# Patient Record
Sex: Female | Born: 1945 | Race: White | Hispanic: No | Marital: Married | Smoking: Former smoker
Health system: Southern US, Community
[De-identification: ages and names within clinical notes are randomized; demographics above are authoritative.]

## PROBLEM LIST (undated history)

## (undated) DIAGNOSIS — D494 Neoplasm of unspecified behavior of bladder: Secondary | ICD-10-CM

## (undated) DIAGNOSIS — I729 Aneurysm of unspecified site: Secondary | ICD-10-CM

## (undated) DIAGNOSIS — J449 Chronic obstructive pulmonary disease, unspecified: Secondary | ICD-10-CM

## (undated) DIAGNOSIS — R0602 Shortness of breath: Secondary | ICD-10-CM

## (undated) DIAGNOSIS — I10 Essential (primary) hypertension: Secondary | ICD-10-CM

## (undated) DIAGNOSIS — M1990 Unspecified osteoarthritis, unspecified site: Secondary | ICD-10-CM

## (undated) DIAGNOSIS — R011 Cardiac murmur, unspecified: Secondary | ICD-10-CM

## (undated) DIAGNOSIS — E039 Hypothyroidism, unspecified: Secondary | ICD-10-CM

## (undated) DIAGNOSIS — J45909 Unspecified asthma, uncomplicated: Secondary | ICD-10-CM

## (undated) DIAGNOSIS — E119 Type 2 diabetes mellitus without complications: Secondary | ICD-10-CM

## (undated) DIAGNOSIS — Z86718 Personal history of other venous thrombosis and embolism: Secondary | ICD-10-CM

---
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
## 1974-08-10 HISTORY — PX: TUBAL LIGATION: SHX77

## 2009-08-10 DIAGNOSIS — I729 Aneurysm of unspecified site: Secondary | ICD-10-CM

## 2009-08-10 HISTORY — DX: Aneurysm of unspecified site: I72.9

## 2009-12-08 DIAGNOSIS — Z86718 Personal history of other venous thrombosis and embolism: Secondary | ICD-10-CM

## 2009-12-08 HISTORY — DX: Personal history of other venous thrombosis and embolism: Z86.718

## 2012-07-06 ENCOUNTER — Other Ambulatory Visit: Payer: Self-pay | Admitting: Neurosurgery

## 2012-07-06 DIAGNOSIS — M48061 Spinal stenosis, lumbar region without neurogenic claudication: Secondary | ICD-10-CM

## 2012-07-20 ENCOUNTER — Ambulatory Visit
Admission: RE | Admit: 2012-07-20 | Discharge: 2012-07-20 | Disposition: A | Discharge status: Home / Self Care | Payer: Medicare Other | Source: Ambulatory Visit | Attending: Neurosurgery | Admitting: Neurosurgery

## 2012-07-20 ENCOUNTER — Other Ambulatory Visit: Payer: Self-pay | Admitting: Neurosurgery

## 2012-07-20 DIAGNOSIS — M48061 Spinal stenosis, lumbar region without neurogenic claudication: Secondary | ICD-10-CM

## 2012-08-09 ENCOUNTER — Encounter (HOSPITAL_COMMUNITY): Payer: Self-pay | Admitting: Pharmacy Technician

## 2012-08-17 ENCOUNTER — Encounter (HOSPITAL_COMMUNITY): Admission: RE | Admit: 2012-08-17 | Payer: Medicare Other | Source: Ambulatory Visit

## 2012-08-25 ENCOUNTER — Encounter (HOSPITAL_COMMUNITY): Payer: Self-pay

## 2012-08-25 ENCOUNTER — Encounter (HOSPITAL_COMMUNITY)
Admission: RE | Admit: 2012-08-25 | Discharge: 2012-08-25 | Disposition: A | Discharge status: Home / Self Care | Payer: Medicare Other | Source: Ambulatory Visit | Attending: Neurosurgery | Admitting: Neurosurgery

## 2012-08-25 ENCOUNTER — Encounter (HOSPITAL_COMMUNITY)
Admission: RE | Admit: 2012-08-25 | Discharge: 2012-08-25 | Disposition: A | Discharge status: Home / Self Care | Payer: Medicare Other | Source: Ambulatory Visit | Attending: Anesthesiology | Admitting: Anesthesiology

## 2012-08-25 ENCOUNTER — Encounter (HOSPITAL_COMMUNITY): Payer: Self-pay | Admitting: Pharmacy Technician

## 2012-08-25 HISTORY — DX: Personal history of other venous thrombosis and embolism: Z86.718

## 2012-08-25 HISTORY — DX: Unspecified osteoarthritis, unspecified site: M19.90

## 2012-08-25 HISTORY — DX: Neoplasm of unspecified behavior of bladder: D49.4

## 2012-08-25 HISTORY — DX: Shortness of breath: R06.02

## 2012-08-25 HISTORY — DX: Essential (primary) hypertension: I10

## 2012-08-25 HISTORY — DX: Aneurysm of unspecified site: I72.9

## 2012-08-25 HISTORY — DX: Chronic obstructive pulmonary disease, unspecified: J44.9

## 2012-08-25 HISTORY — DX: Cardiac murmur, unspecified: R01.1

## 2012-08-25 HISTORY — DX: Hypothyroidism, unspecified: E03.9

## 2012-08-25 LAB — SURGICAL PCR SCREEN: Staphylococcus aureus: NEGATIVE

## 2012-08-25 LAB — BASIC METABOLIC PANEL
CO2: 25 mEq/L (ref 19–32)
Calcium: 10.6 mg/dL — ABNORMAL HIGH (ref 8.4–10.5)
Chloride: 96 mEq/L (ref 96–112)
GFR calc Af Amer: >90 mL/min (ref >90)
Sodium: 136 mEq/L (ref 135–145)

## 2012-08-25 LAB — ABO/RH: ABO/RH(D): O NEG

## 2012-08-25 LAB — TYPE AND SCREEN

## 2012-08-25 LAB — CBC
HCT: 43.5 % (ref 36.0–46.0)
MCV: 95.6 fL (ref 78.0–100.0)
Platelets: 436 10*3/uL — ABNORMAL HIGH (ref 150–400)
RBC: 4.55 MIL/uL (ref 3.87–5.11)
WBC: 7.9 10*3/uL (ref 4.0–10.5)

## 2012-08-25 NOTE — Progress Notes (Signed)
Pt here for PAT.  Denies sleep apnea and/or sleep studies.  Reports had hx of blood clot in lung(2000's) that is resolved.  Sleep w/ O2@2L  at night because of COPD.  See Pulmonologist: Dr. 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 Angelina Ok, [facility], [place], [phone]).  Requested copies of ECHO, medical clearance,and OV note from Dr. Carney Harder. Pt seeing next week for routine f/u.   Requested copies of OV Note, medical clearance from Dr. Angelina Ok.

## 2012-08-25 NOTE — Pre-Procedure Instructions (Addendum)
Megan Fleming  08/25/2012   Your procedure is scheduled on: 09/02/12  Report to [facility] at 530 AM.  Call this number if you have problems the morning of surgery: [phone]   Remember:   Do not eat food or drink liquids after midnight.   Take these medicines the morning of surgery with A SIP OF WATER:inhalers, veramyst, levothyroxine, nebivolol (bystolic), effexor STOP  Krill oil, multivit   Do not wear jewelry, make-up or nail polish.  Do not wear lotions, powders, or perfumes. You may not wear deodorant.  Do not shave 48 hours prior to surgery. Men may shave face and neck.  Do not bring valuables to the hospital.  Contacts, dentures or bridgework may not be worn into surgery.  Leave suitcase in the car. After surgery it may be brought to your room.  For patients admitted to the hospital, checkout time is 11:00 AM the day of  discharge.   Patients discharged the day of surgery will not be allowed to drive  home.  Name and phone number of your driver: Megan Fleming, husban  Special Instructions: Shower using CHG 2 nights before surgery and the night before surgery.  If you shower the day of surgery use CHG.  Use special wash - you have one bottle of CHG for all showers.  You should use approximately 1/3 of the bottle for each shower.   Please read over the following fact sheets that you were given: Pain Booklet, Coughing and Deep Breathing, MRSA Information and Surgical Site Infection Prevention

## 2012-08-29 ENCOUNTER — Encounter (HOSPITAL_COMMUNITY): Payer: Self-pay | Admitting: Vascular Surgery

## 2012-08-29 NOTE — Consult Note (Signed)
Anesthesia chart review: Patient is a 67 year old female scheduled for L3-4, L4-5 PLIF by Dr. Gerlene Fee on 09/03/2011. History includes former smoker, obesity, hypertension, hypothyroidism, COPD with home O2 with sleep and exertion as needed, heart murmur (not specified) but with mild AR/TR by echo 11/2010, AAA (4.26 cm on 08/23/12 by ultrasound (Dr. Angelina Ok), arthritis, PE '11, bladder tumor ?'00 by Epic history.  She is followed at [facility], last visit on 03/02/12 with Lysle Pearl, PA-C.  I requested her last PFT studies as available.  EKG on 08/25/12 showed NSR.  Echo on 11/19/10 showed normal LV systolic function, LVEF 70%, normal diastolic filling pattern for age, mild aortic valve sclerosis with trace to mild AR, mild TR with normal pulmonary pressures.    CXR on 08/25/12 showed emphysematous and bronchitic changes consistent with COPD.  Preoperative labs noted.  I was not asked to evaluate patient during her PAT visit. RA O2 sat 97%.  I reviewed history with Anesthesiologist Dr. Krista Blue.  She will be evaluated by her assigned anesthesiologist on the day of surgery.  If not acute respiratory symptoms then would anticipate she could proceed as planned.  Shonna Chock, PA-C 08/29/12 1322

## 2012-09-01 MED ORDER — CEFAZOLIN SODIUM-DEXTROSE 2-3 GM-% IV SOLR
2.0000 g | INTRAVENOUS | Status: AC
  Administered 2012-09-02: 2 g via INTRAVENOUS
  Filled 2012-09-01: qty 50

## 2012-09-02 ENCOUNTER — Inpatient Hospital Stay (HOSPITAL_COMMUNITY): Payer: Medicare Other | Admitting: Vascular Surgery

## 2012-09-02 ENCOUNTER — Inpatient Hospital Stay (HOSPITAL_COMMUNITY): Payer: Medicare Other

## 2012-09-02 ENCOUNTER — Encounter (HOSPITAL_COMMUNITY)
Admission: RE | Disposition: A | Discharge status: Home / Self Care | Payer: Self-pay | Source: Ambulatory Visit | Attending: Neurosurgery

## 2012-09-02 ENCOUNTER — Encounter (HOSPITAL_COMMUNITY): Payer: Self-pay | Admitting: Vascular Surgery

## 2012-09-02 ENCOUNTER — Encounter (HOSPITAL_COMMUNITY): Payer: Self-pay | Admitting: *Deleted

## 2012-09-02 ENCOUNTER — Inpatient Hospital Stay (HOSPITAL_COMMUNITY)
Admission: RE | Admit: 2012-09-02 | Discharge: 2012-09-05 | DRG: 460 | Disposition: A | Discharge status: Home / Self Care | Payer: Medicare Other | Source: Ambulatory Visit | Attending: Neurosurgery | Admitting: Neurosurgery

## 2012-09-02 DIAGNOSIS — I10 Essential (primary) hypertension: Secondary | ICD-10-CM | POA: Diagnosis present

## 2012-09-02 DIAGNOSIS — E119 Type 2 diabetes mellitus without complications: Secondary | ICD-10-CM | POA: Diagnosis present

## 2012-09-02 DIAGNOSIS — Z7982 Long term (current) use of aspirin: Secondary | ICD-10-CM

## 2012-09-02 DIAGNOSIS — J449 Chronic obstructive pulmonary disease, unspecified: Secondary | ICD-10-CM | POA: Diagnosis present

## 2012-09-02 DIAGNOSIS — M48061 Spinal stenosis, lumbar region without neurogenic claudication: Secondary | ICD-10-CM

## 2012-09-02 DIAGNOSIS — I719 Aortic aneurysm of unspecified site, without rupture: Secondary | ICD-10-CM | POA: Diagnosis present

## 2012-09-02 DIAGNOSIS — J4489 Other specified chronic obstructive pulmonary disease: Secondary | ICD-10-CM | POA: Diagnosis present

## 2012-09-02 DIAGNOSIS — E039 Hypothyroidism, unspecified: Secondary | ICD-10-CM | POA: Diagnosis present

## 2012-09-02 HISTORY — DX: Type 2 diabetes mellitus without complications: E11.9

## 2012-09-02 LAB — GLUCOSE, CAPILLARY: Glucose-Capillary: 185 mg/dL — ABNORMAL HIGH (ref 70–99)

## 2012-09-02 SURGERY — POSTERIOR LUMBAR FUSION 2 LEVEL
Anesthesia: General | Site: Back | Wound class: Clean

## 2012-09-02 MED ORDER — SODIUM CHLORIDE 0.9 % IJ SOLN: 3.0000 mL | INTRAMUSCULAR | Status: DC | PRN

## 2012-09-02 MED ORDER — METFORMIN HCL ER 500 MG PO TB24
500.0000 mg | ORAL_TABLET | Freq: Two times a day (BID) | ORAL | Status: DC
  Administered 2012-09-02 – 2012-09-05 (×6): 500 mg via ORAL
  Filled 2012-09-02 (×8): qty 1

## 2012-09-02 MED ORDER — HYDROMORPHONE HCL PF 1 MG/ML IJ SOLN
1.0000 mg | INTRAMUSCULAR | Status: DC | PRN
  Administered 2012-09-02 (×2): 1 mg via INTRAMUSCULAR
  Filled 2012-09-02 (×2): qty 1

## 2012-09-02 MED ORDER — HEMOSTATIC AGENTS (NO CHARGE) OPTIME
TOPICAL | Status: DC | PRN
  Administered 2012-09-02 (×2): 1 via TOPICAL

## 2012-09-02 MED ORDER — LEVOTHYROXINE SODIUM 88 MCG PO TABS
88.0000 ug | ORAL_TABLET | Freq: Every day | ORAL | Status: DC
  Administered 2012-09-03 – 2012-09-05 (×3): 88 ug via ORAL
  Filled 2012-09-02 (×4): qty 1

## 2012-09-02 MED ORDER — NEOSTIGMINE METHYLSULFATE 1 MG/ML IJ SOLN
INTRAMUSCULAR | Status: DC | PRN
  Administered 2012-09-02: 5 mg via INTRAVENOUS

## 2012-09-02 MED ORDER — MIDAZOLAM HCL 5 MG/5ML IJ SOLN
INTRAMUSCULAR | Status: DC | PRN
  Administered 2012-09-02: 2 mg via INTRAVENOUS

## 2012-09-02 MED ORDER — GLYCOPYRROLATE 0.2 MG/ML IJ SOLN
INTRAMUSCULAR | Status: DC | PRN
  Administered 2012-09-02: 0.6 mg via INTRAVENOUS

## 2012-09-02 MED ORDER — PANTOPRAZOLE SODIUM 40 MG IV SOLR
40.0000 mg | Freq: Every day | INTRAVENOUS | Status: DC
  Filled 2012-09-02: qty 40

## 2012-09-02 MED ORDER — HYDROMORPHONE HCL PF 1 MG/ML IJ SOLN: 0.2500 mg | INTRAMUSCULAR | Status: DC | PRN

## 2012-09-02 MED ORDER — SODIUM CHLORIDE 0.9 % IV SOLN
INTRAVENOUS | Status: DC | PRN
  Administered 2012-09-02: 12:00:00 via INTRAVENOUS

## 2012-09-02 MED ORDER — LISINOPRIL 5 MG PO TABS
5.0000 mg | ORAL_TABLET | Freq: Every day | ORAL | Status: DC
  Administered 2012-09-03 – 2012-09-05 (×3): 5 mg via ORAL
  Filled 2012-09-02 (×3): qty 1

## 2012-09-02 MED ORDER — LIDOCAINE HCL (CARDIAC) 20 MG/ML IV SOLN
INTRAVENOUS | Status: DC | PRN
  Administered 2012-09-02: 50 mg via INTRAVENOUS
  Administered 2012-09-02: 80 mg via INTRAVENOUS

## 2012-09-02 MED ORDER — PANTOPRAZOLE SODIUM 40 MG PO TBEC
40.0000 mg | DELAYED_RELEASE_TABLET | Freq: Every day | ORAL | Status: DC
  Administered 2012-09-02: 40 mg via ORAL
  Filled 2012-09-02 (×2): qty 1

## 2012-09-02 MED ORDER — VENLAFAXINE HCL 75 MG PO TABS
150.0000 mg | ORAL_TABLET | Freq: Every day | ORAL | Status: DC
  Administered 2012-09-03 – 2012-09-05 (×3): 150 mg via ORAL
  Filled 2012-09-02 (×3): qty 2

## 2012-09-02 MED ORDER — VECURONIUM BROMIDE 10 MG IV SOLR
INTRAVENOUS | Status: DC | PRN
  Administered 2012-09-02: 5 mg via INTRAVENOUS
  Administered 2012-09-02: 3 mg via INTRAVENOUS
  Administered 2012-09-02 (×2): 2 mg via INTRAVENOUS

## 2012-09-02 MED ORDER — ACETAMINOPHEN 10 MG/ML IV SOLN: 1000.0000 mg | Freq: Once | INTRAVENOUS | Status: DC

## 2012-09-02 MED ORDER — MENTHOL 3 MG MT LOZG
1.0000 | LOZENGE | OROMUCOSAL | Status: DC | PRN
  Filled 2012-09-02: qty 9

## 2012-09-02 MED ORDER — HYDROCODONE-ACETAMINOPHEN 5-325 MG PO TABS
1.0000 | ORAL_TABLET | ORAL | Status: DC | PRN
  Administered 2012-09-03 – 2012-09-05 (×10): 2 via ORAL
  Filled 2012-09-02 (×10): qty 2

## 2012-09-02 MED ORDER — DEXTROSE 5 % IV SOLN
INTRAVENOUS | Status: DC | PRN
  Administered 2012-09-02: 08:00:00 via INTRAVENOUS

## 2012-09-02 MED ORDER — CYCLOBENZAPRINE HCL 10 MG PO TABS
10.0000 mg | ORAL_TABLET | Freq: Three times a day (TID) | ORAL | Status: DC | PRN
  Administered 2012-09-04 – 2012-09-05 (×3): 10 mg via ORAL
  Filled 2012-09-02 (×3): qty 1

## 2012-09-02 MED ORDER — BACITRACIN 50000 UNITS IM SOLR
INTRAMUSCULAR | Status: AC
  Filled 2012-09-02: qty 1

## 2012-09-02 MED ORDER — EPHEDRINE SULFATE 50 MG/ML IJ SOLN
INTRAMUSCULAR | Status: DC | PRN
  Administered 2012-09-02 (×3): 5 mg via INTRAVENOUS

## 2012-09-02 MED ORDER — GUAIFENESIN ER 600 MG PO TB12
600.0000 mg | ORAL_TABLET | Freq: Every day | ORAL | Status: DC
  Administered 2012-09-02 – 2012-09-05 (×4): 600 mg via ORAL
  Filled 2012-09-02 (×4): qty 1

## 2012-09-02 MED ORDER — THROMBIN 20000 UNITS EX SOLR
CUTANEOUS | Status: DC | PRN
  Administered 2012-09-02 (×2): via TOPICAL

## 2012-09-02 MED ORDER — LACTATED RINGERS IV SOLN
INTRAVENOUS | Status: DC | PRN
  Administered 2012-09-02 (×4): via INTRAVENOUS

## 2012-09-02 MED ORDER — ALBUMIN HUMAN 5 % IV SOLN
INTRAVENOUS | Status: DC | PRN
  Administered 2012-09-02: 10:00:00 via INTRAVENOUS

## 2012-09-02 MED ORDER — ARTIFICIAL TEARS OP OINT
TOPICAL_OINTMENT | OPHTHALMIC | Status: DC | PRN
  Administered 2012-09-02: 1 via OPHTHALMIC

## 2012-09-02 MED ORDER — POTASSIUM CHLORIDE IN NACL 20-0.9 MEQ/L-% IV SOLN
INTRAVENOUS | Status: DC
  Administered 2012-09-02: 15:00:00 via INTRAVENOUS
  Filled 2012-09-02 (×7): qty 1000

## 2012-09-02 MED ORDER — ACETAMINOPHEN 325 MG PO TABS: 650.0000 mg | ORAL_TABLET | ORAL | Status: DC | PRN

## 2012-09-02 MED ORDER — BUPIVACAINE HCL (PF) 0.5 % IJ SOLN
INTRAMUSCULAR | Status: DC | PRN
  Administered 2012-09-02: 30 mL

## 2012-09-02 MED ORDER — SODIUM CHLORIDE 0.9 % IV SOLN
INTRAVENOUS | Status: AC
  Filled 2012-09-02: qty 500

## 2012-09-02 MED ORDER — INSULIN ASPART 100 UNIT/ML ~~LOC~~ SOLN
0.0000 [IU] | Freq: Three times a day (TID) | SUBCUTANEOUS | Status: DC
  Administered 2012-09-02: 3 [IU] via SUBCUTANEOUS
  Administered 2012-09-03: 2 [IU] via SUBCUTANEOUS
  Administered 2012-09-03: 3 [IU] via SUBCUTANEOUS
  Administered 2012-09-04: 2 [IU] via SUBCUTANEOUS

## 2012-09-02 MED ORDER — FENTANYL CITRATE 0.05 MG/ML IJ SOLN
INTRAMUSCULAR | Status: DC | PRN
  Administered 2012-09-02 (×4): 50 ug via INTRAVENOUS
  Administered 2012-09-02: 150 ug via INTRAVENOUS
  Administered 2012-09-02: 50 ug via INTRAVENOUS
  Administered 2012-09-02: 100 ug via INTRAVENOUS

## 2012-09-02 MED ORDER — INSULIN ASPART 100 UNIT/ML ~~LOC~~ SOLN: 0.0000 [IU] | Freq: Every day | SUBCUTANEOUS | Status: DC

## 2012-09-02 MED ORDER — ACETAMINOPHEN 10 MG/ML IV SOLN
INTRAVENOUS | Status: AC
  Administered 2012-09-02: 1000 mg via INTRAVENOUS
  Filled 2012-09-02: qty 100

## 2012-09-02 MED ORDER — ONDANSETRON HCL 4 MG/2ML IJ SOLN: 4.0000 mg | INTRAMUSCULAR | Status: DC | PRN

## 2012-09-02 MED ORDER — ONDANSETRON HCL 4 MG/2ML IJ SOLN
INTRAMUSCULAR | Status: DC | PRN
  Administered 2012-09-02 (×2): 4 mg via INTRAVENOUS

## 2012-09-02 MED ORDER — SODIUM CHLORIDE 0.9 % IV SOLN
10.0000 mg | INTRAVENOUS | Status: DC | PRN
  Administered 2012-09-02: 10 ug/min via INTRAVENOUS

## 2012-09-02 MED ORDER — SODIUM CHLORIDE 0.9 % IJ SOLN
3.0000 mL | Freq: Two times a day (BID) | INTRAMUSCULAR | Status: DC
  Administered 2012-09-02 – 2012-09-05 (×6): 3 mL via INTRAVENOUS

## 2012-09-02 MED ORDER — 0.9 % SODIUM CHLORIDE (POUR BTL) OPTIME
TOPICAL | Status: DC | PRN
  Administered 2012-09-02: 1000 mL

## 2012-09-02 MED ORDER — NEBIVOLOL HCL 10 MG PO TABS
10.0000 mg | ORAL_TABLET | Freq: Every day | ORAL | Status: DC
  Administered 2012-09-03 – 2012-09-05 (×3): 10 mg via ORAL
  Filled 2012-09-02 (×3): qty 1

## 2012-09-02 MED ORDER — DEXAMETHASONE SODIUM PHOSPHATE 10 MG/ML IJ SOLN
10.0000 mg | INTRAMUSCULAR | Status: DC
  Filled 2012-09-02: qty 1

## 2012-09-02 MED ORDER — BUDESONIDE-FORMOTEROL FUMARATE 160-4.5 MCG/ACT IN AERO
2.0000 | INHALATION_SPRAY | Freq: Two times a day (BID) | RESPIRATORY_TRACT | Status: DC
  Administered 2012-09-03 – 2012-09-05 (×3): 2 via RESPIRATORY_TRACT
  Filled 2012-09-02 (×2): qty 6

## 2012-09-02 MED ORDER — ESTROGENS, CONJUGATED 0.625 MG/GM VA CREA: 0.5000 g | TOPICAL_CREAM | VAGINAL | Status: DC

## 2012-09-02 MED ORDER — MIDAZOLAM HCL 2 MG/2ML IJ SOLN: 1.0000 mg | INTRAMUSCULAR | Status: DC | PRN

## 2012-09-02 MED ORDER — ROCURONIUM BROMIDE 100 MG/10ML IV SOLN
INTRAVENOUS | Status: DC | PRN
  Administered 2012-09-02: 50 mg via INTRAVENOUS

## 2012-09-02 MED ORDER — ALBUTEROL SULFATE HFA 108 (90 BASE) MCG/ACT IN AERS
2.0000 | INHALATION_SPRAY | RESPIRATORY_TRACT | Status: DC | PRN
  Filled 2012-09-02 (×2): qty 6.7

## 2012-09-02 MED ORDER — ACETAMINOPHEN 650 MG RE SUPP: 650.0000 mg | RECTAL | Status: DC | PRN

## 2012-09-02 MED ORDER — PHENOL 1.4 % MT LIQD: 1.0000 | OROMUCOSAL | Status: DC | PRN

## 2012-09-02 MED ORDER — SODIUM CHLORIDE 0.9 % IR SOLN
Status: DC | PRN
  Administered 2012-09-02: 07:00:00

## 2012-09-02 MED ORDER — CEFAZOLIN SODIUM-DEXTROSE 2-3 GM-% IV SOLR
2.0000 g | Freq: Three times a day (TID) | INTRAVENOUS | Status: AC
  Administered 2012-09-02 – 2012-09-03 (×3): 2 g via INTRAVENOUS
  Filled 2012-09-02 (×3): qty 50

## 2012-09-02 MED ORDER — FENTANYL CITRATE 0.05 MG/ML IJ SOLN: 50.0000 ug | Freq: Once | INTRAMUSCULAR | Status: DC

## 2012-09-02 MED ORDER — DEXAMETHASONE SODIUM PHOSPHATE 4 MG/ML IJ SOLN
INTRAMUSCULAR | Status: DC | PRN
  Administered 2012-09-02: 10 mg via INTRAVENOUS

## 2012-09-02 MED ORDER — OXYCODONE HCL 5 MG PO TABS: 5.0000 mg | ORAL_TABLET | Freq: Once | ORAL | Status: DC | PRN

## 2012-09-02 MED ORDER — INSULIN ASPART 100 UNIT/ML ~~LOC~~ SOLN
4.0000 [IU] | Freq: Three times a day (TID) | SUBCUTANEOUS | Status: DC
  Administered 2012-09-02 – 2012-09-04 (×7): 4 [IU] via SUBCUTANEOUS

## 2012-09-02 MED ORDER — PROPOFOL 10 MG/ML IV BOLUS
INTRAVENOUS | Status: DC | PRN
  Administered 2012-09-02: 160 mg via INTRAVENOUS

## 2012-09-02 MED ORDER — GEMFIBROZIL 600 MG PO TABS
600.0000 mg | ORAL_TABLET | Freq: Two times a day (BID) | ORAL | Status: DC
  Administered 2012-09-02 – 2012-09-05 (×6): 600 mg via ORAL
  Filled 2012-09-02 (×8): qty 1

## 2012-09-02 MED ORDER — PROMETHAZINE HCL 25 MG/ML IJ SOLN: 6.2500 mg | INTRAMUSCULAR | Status: DC | PRN

## 2012-09-02 MED ORDER — OXYCODONE HCL 5 MG/5ML PO SOLN
5.0000 mg | Freq: Once | ORAL | Status: DC | PRN
Start: 2012-09-02 — End: 2012-09-02

## 2012-09-02 SURGICAL SUPPLY — 63 items
BAG DECANTER FOR FLEXI CONT (MISCELLANEOUS) ×2 IMPLANT
BENZOIN TINCTURE PRP APPL 2/3 (GAUZE/BANDAGES/DRESSINGS) ×4 IMPLANT
BLADE SURG ROTATE 9660 (MISCELLANEOUS) ×2 IMPLANT
BONE EQUIVA 10CC (Bone Implant) ×4 IMPLANT
BRUSH SCRUB EZ PLAIN DRY (MISCELLANEOUS) ×2 IMPLANT
BUR CUTTER 7.0 ROUND (BURR) ×4 IMPLANT
BUR MATCHSTICK NEURO 3.0 LAGG (BURR) ×2 IMPLANT
CANISTER SUCTION 2500CC (MISCELLANEOUS) ×2 IMPLANT
CLOTH BEACON ORANGE TIMEOUT ST (SAFETY) ×4 IMPLANT
CONT SPEC 4OZ CLIKSEAL STRL BL (MISCELLANEOUS) ×4 IMPLANT
COVER BACK TABLE 24X17X13 BIG (DRAPES) IMPLANT
COVER TABLE BACK 60X90 (DRAPES) ×2 IMPLANT
DERMABOND ADVANCED (GAUZE/BANDAGES/DRESSINGS) ×2
DERMABOND ADVANCED .7 DNX12 (GAUZE/BANDAGES/DRESSINGS) ×2 IMPLANT
DRAPE C-ARM 42X72 X-RAY (DRAPES) ×8 IMPLANT
DRAPE LAPAROTOMY 100X72X124 (DRAPES) ×2 IMPLANT
DRAPE SURG 17X23 STRL (DRAPES) ×4 IMPLANT
DRESSING TELFA 8X3 (GAUZE/BANDAGES/DRESSINGS) ×2 IMPLANT
ELECT REM PT RETURN 9FT ADLT (ELECTROSURGICAL) ×2
ELECTRODE REM PT RTRN 9FT ADLT (ELECTROSURGICAL) ×1 IMPLANT
EVACUATOR 1/8 PVC DRAIN (DRAIN) ×4 IMPLANT
GAUZE SPONGE 4X4 16PLY XRAY LF (GAUZE/BANDAGES/DRESSINGS) IMPLANT
GLOVE BIOGEL PI IND STRL 8 (GLOVE) ×3 IMPLANT
GLOVE BIOGEL PI INDICATOR 8 (GLOVE) ×3
GLOVE ECLIPSE 7.5 STRL STRAW (GLOVE) ×6 IMPLANT
GLOVE INDICATOR 7.5 STRL GRN (GLOVE) ×2 IMPLANT
GLOVE INDICATOR 8.0 STRL GRN (GLOVE) ×2 IMPLANT
GLOVE INDICATOR 8.5 STRL (GLOVE) ×2 IMPLANT
GOWN BRE IMP SLV AUR LG STRL (GOWN DISPOSABLE) ×4 IMPLANT
GOWN BRE IMP SLV AUR XL STRL (GOWN DISPOSABLE) ×4 IMPLANT
GOWN STRL REIN 2XL LVL4 (GOWN DISPOSABLE) ×4 IMPLANT
IMPLANT ARDIS PEEK 109X22 (Orthopedic Implant) ×4 IMPLANT
KIT BASIN OR (CUSTOM PROCEDURE TRAY) ×2 IMPLANT
KIT ROOM TURNOVER OR (KITS) ×2 IMPLANT
MILL MEDIUM DISP (BLADE) ×2 IMPLANT
NEEDLE HYPO 22GX1.5 SAFETY (NEEDLE) ×2 IMPLANT
NS IRRIG 1000ML POUR BTL (IV SOLUTION) ×2 IMPLANT
PACK LAMINECTOMY NEURO (CUSTOM PROCEDURE TRAY) ×2 IMPLANT
PAD ARMBOARD 7.5X6 YLW CONV (MISCELLANEOUS) ×6 IMPLANT
PATTIES SURGICAL .75X.75 (GAUZE/BANDAGES/DRESSINGS) ×2 IMPLANT
PATTIES SURGICAL 1X1 (DISPOSABLE) ×2 IMPLANT
PEEK OPTIMA 9X9X22 (Peek) ×4 IMPLANT
ROD PERBENT 70MM (Rod) ×4 IMPLANT
SCREW POLY 6.5X40MM (Screw) ×4 IMPLANT
SCREW POLY 6.5X45 (Screw) ×4 IMPLANT
SCREW POLY 6.5X45MM (Screw) ×4 IMPLANT
SPEEDLINK 11 MEDIUM (Rod) ×2 IMPLANT
SPEEDLINK LRG (Rod) ×2 IMPLANT
SPONGE GAUZE 4X4 12PLY (GAUZE/BANDAGES/DRESSINGS) ×2 IMPLANT
SPONGE LAP 4X18 X RAY DECT (DISPOSABLE) IMPLANT
SPONGE SURGIFOAM ABS GEL 100 (HEMOSTASIS) ×6 IMPLANT
STRIP CLOSURE SKIN 1/2X4 (GAUZE/BANDAGES/DRESSINGS) ×2 IMPLANT
SUT VIC AB 0 CT1 18XCR BRD8 (SUTURE) ×2 IMPLANT
SUT VIC AB 0 CT1 8-18 (SUTURE) ×2
SUT VIC AB 2-0 OS6 18 (SUTURE) ×8 IMPLANT
SUT VIC AB 3-0 CP2 18 (SUTURE) ×2 IMPLANT
SYR 20ML ECCENTRIC (SYRINGE) ×4 IMPLANT
TOP CLSR SEQUOIA (Orthopedic Implant) ×12 IMPLANT
TOWEL OR 17X24 6PK STRL BLUE (TOWEL DISPOSABLE) ×2 IMPLANT
TOWEL OR 17X26 10 PK STRL BLUE (TOWEL DISPOSABLE) ×2 IMPLANT
TRAP SPECIMEN MUCOUS 40CC (MISCELLANEOUS) ×2 IMPLANT
TRAY FOLEY CATH 14FRSI W/METER (CATHETERS) ×2 IMPLANT
WATER STERILE IRR 1000ML POUR (IV SOLUTION) ×2 IMPLANT

## 2012-09-02 NOTE — Progress Notes (Signed)
UR COMPLETED  

## 2012-09-02 NOTE — Preoperative (Signed)
Beta Blockers   Reason not to administer Beta Blockers:Pt took Bystolic 10 MG PO @1100  pm on 09/01/12

## 2012-09-02 NOTE — Anesthesia Preprocedure Evaluation (Addendum)
Anesthesia Evaluation  Patient identified by MRN, date of birth, ID band Patient awake    Reviewed: Allergy & Precautions, H&P , NPO status , Patient's Chart, lab work & pertinent test results  Airway Mallampati: II TM Distance: <3 FB Neck ROM: Full    Dental   Pulmonary shortness of breath, COPDformer smoker,  + rhonchi         Cardiovascular hypertension, Rhythm:Regular Rate:Normal     Neuro/Psych    GI/Hepatic   Endo/Other  diabetesHypothyroidism   Renal/GU      Musculoskeletal   Abdominal (+) + obese,   Peds  Hematology   Anesthesia Other Findings   Reproductive/Obstetrics                          Anesthesia Physical Anesthesia Plan  ASA: III  Anesthesia Plan: General   Post-op Pain Management:    Induction: Intravenous  Airway Management Planned: Oral ETT  Additional Equipment:   Intra-op Plan:   Post-operative Plan: Extubation in OR  Informed Consent: I have reviewed the patients History and Physical, chart, labs and discussed the procedure including the risks, benefits and alternatives for the proposed anesthesia with the patient or authorized representative who has indicated his/her understanding and acceptance.     Plan Discussed with: CRNA and Surgeon  Anesthesia Plan Comments:         Anesthesia Quick Evaluation

## 2012-09-02 NOTE — Plan of Care (Signed)
Problem: Consults Goal: Diagnosis - Spinal Surgery Outcome: Completed/Met Date Met:  09/02/12 Thoraco/Lumbar Spine Fusion     

## 2012-09-02 NOTE — Anesthesia Postprocedure Evaluation (Signed)
  Anesthesia Post-op Note  Patient: Megan Fleming  Procedure(s) Performed: Procedure(s) (LRB) with comments: POSTERIOR LUMBAR FUSION 2 LEVEL (N/A) - lumbar three-four,four five posterior lumbar interbody fusion   Patient Location: PACU  Anesthesia Type:General  Level of Consciousness: awake  Airway and Oxygen Therapy: Patient Spontanous Breathing  Post-op Pain: mild  Post-op Assessment: Post-op Vital signs reviewed, Patient's Cardiovascular Status Stable, Respiratory Function Stable, Patent Airway, No signs of Nausea or vomiting and Pain level controlled  Post-op Vital Signs: stable  Complications: No apparent anesthesia complications

## 2012-09-02 NOTE — H&P (Signed)
Megan Fleming is an 67 y.o. female.   Chief Complaint: Back and leg pain with numbness HPI: The patient is 67 year old female who presented with back and bilateral lower extremity issues with pain and numbness presently down her right leg. She was tried on aggressive conservative therapy with no improvement. 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 After discussing the options the patient requested surgery and now comes for a two-level posterior lumbar interbody fusion with pedicle screw fixation. I've had a long discussion with her regarding the risks and benefits of surgical intervention. The risks discussed include but are not limited to bleeding infection weakness numbness paralysis trouble with instrumentation nonunion coma and death. We have discussed alternative methods of therapy although risks and benefits of nonintervention. She has had the opportunity to ask numerous questions and appears to understand. With this information in hand she has requested we proceed with surgery.  Past Medical History  Diagnosis Date  . Hypothyroidism   . Hypertension   . Heart murmur   . Shortness of breath     On exertion- followed by pulmonologist-Dr. Lottie Rater.  Marland Kitchen COPD (chronic obstructive pulmonary disease)     Empysema  . Aneurysm 2011    size=4.1 at Aorta, sees Dr. Jamison Neighbor Cruz(vascular)  . Bladder tumor ?2000?    incontinence-? intertistitial cystitis?  Marland Kitchen Hx of blood clots 12/2009    PE May 2011  . Arthritis     Fingers  . Diabetes mellitus without complication     Past Surgical History  Procedure Date  . Cesarean section [id number]  . Tonsillectomy 1959  . Tubal ligation 1976    History reviewed. No pertinent family history. Social History:  reports that she quit smoking about 9 years ago. Her smoking use included Cigarettes. She does not have any smokeless tobacco history on file. She reports that she does not drink alcohol. Her drug  history not on file.  Allergies: No Known Allergies  Medications Prior to Admission  Medication Sig Dispense Refill  . albuterol (PROVENTIL HFA;VENTOLIN HFA) 108 (90 BASE) MCG/ACT inhaler Inhale 2 puffs into the lungs every 4 (four) hours as needed. For wheezing      . aspirin 325 MG tablet Take 162.5 mg by mouth 2 (two) times daily.      . budesonide-formoterol (SYMBICORT) 160-4.5 MCG/ACT inhaler Inhale 2 puffs into the lungs 2 (two) times daily.      . Calcium Carbonate-Vitamin D (CALCIUM + D PO) Take 1 tablet by mouth daily.      . cephALEXin (KEFLEX) 500 MG capsule Take 500 mg by mouth at bedtime.      . conjugated estrogens (PREMARIN) vaginal cream Place 0.5 g vaginally 2 (two) times a week. Uses on Mon and Fri      . fluticasone (VERAMYST) 27.5 MCG/SPRAY nasal spray Place 2 sprays into the nose at bedtime.      Marland Kitchen gemfibrozil (LOPID) 600 MG tablet Take 600 mg by mouth 2 (two) times daily before a meal.      . guaiFENesin (MUCINEX) 600 MG 12 hr tablet Take 600 mg by mouth daily.      Marland Kitchen ibandronate (BONIVA) 150 MG tablet Take 150 mg by mouth every 30 (thirty) days. Take in the morning with a full glass of water, on an empty stomach, and do not take anything else by mouth or lie down for the next 30 min. Takes on the 5th of every month.      Marland Kitchen  KRILL OIL OMEGA-3 PO Take 1 capsule by mouth daily.      Marland Kitchen levothyroxine (SYNTHROID, LEVOTHROID) 88 MCG tablet Take 88 mcg by mouth every morning.      Marland Kitchen lisinopril (PRINIVIL,ZESTRIL) 10 MG tablet Take 5 mg by mouth daily.      . metFORMIN (GLUMETZA) 500 MG (MOD) 24 hr tablet Take 500 mg by mouth 2 (two) times daily with a meal.      . Multiple Vitamin (MULTIVITAMIN WITH MINERALS) TABS Take 1 tablet by mouth daily.      . nebivolol (BYSTOLIC) 10 MG tablet Take 10 mg by mouth daily.      . niacin (NIASPAN) 500 MG CR tablet Take 500 mg by mouth at bedtime.      Marland Kitchen venlafaxine (EFFEXOR) 75 MG tablet Take 150 mg by mouth daily.        No results found  for this or any previous visit (from the past 48 hour(s)). No results found.  Review of systems not obtained due to patient factors.  Blood pressure 133/79, pulse 67, temperature 96.9 F (36.1 C), temperature source Oral, resp. rate 20, height 5\' 3"  (1.6 m), weight 80.287 kg (177 lb), SpO2 96.00%.  The patient is awake alert and oriented. She is no facial asymmetry. Her gait is slow mildly antalgic. Her reflexes are decreased but equal. His mild weakness of the quadriceps muscle in her sensation is intact Assessment/Plan Impression is that of listhesis and stenosis L3-4 and L4-5. The plan is for a two-level interbody fusion with pedicle screw fixation.  Reinaldo Meeker, MD 09/02/2012, 7:33 AM

## 2012-09-02 NOTE — Progress Notes (Signed)
pts blood sugar is 165

## 2012-09-02 NOTE — Op Note (Signed)
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 Localizing fluoroscopy was used prior to incision to identify the appropriate level. Midline incision was made above the spinous processes of L2 L3-L4 and L5. Using Bovie cutting current the incision was carried on the spinous processes. Subperiosteal dissection was then carried out bilaterally on the spinous processes and lamina and to the far lateral region to identify the transverse processes of L3-L4 and L5. Self-retaining tract was placed for exposure and x-ray showed approach the appropriate levels. Using the Leksell rongeur spinous processes of L3-L4 and L5 were removed. Starting the patient's right side generous laminotomy was performed removing the inferior 80% of the L3 lamina the medial three quarters of the facet joint and the superior one third of the L4 lamina. Similar decompression was then carried on the opposite side of residual midline structures were removed to complete the bilateral decompression. Similar decompression was then carried out at L4-5 once again removing the inferior half of the L4 lamina the medial three quarters of sexually the superior one third of the L5 lamina. Once again residual bone and ligamentum flavum removed in a piecemeal fashion completely decompression was carried out. At this time the disc spaces were entered and thoroughly cleaned out pituitary rongeurs and curettes  at L3-4 L4-5 bilaterally. Thorough displaced cleanout was carried out the same time great care was taken to avoid injury to the neural this was successfully done. At this time posterior lumbar interbody fusion was carried out of both levels. Starting L4-5 we distracted the disc space up to a 9 mm size and felt this was a good fit. We then prepared the opposite side for a cage and impacted 9 x 9 mm cage filled with morselized bone and allograft. Similar changes placed on the opposite side. Prior to placing the second cage autologous bone morselized allograft were placed in the midline to help at L3-4 the disc space was distracted up to a 10 mm size and this was felt to be a good choice. We did the same type procedure at L3-4 by placing a cage filled with autologous bone morselized allograft and the opposite side placed a cage as well after placing the same mixture within the interspace to help with interbody fusion. Both cages were followed in good position bilaterally at both levels under fluoroscopy. We then placed pedicle screws at L3-4 and 5 bilaterally in standard fashion. We used drill hole entry point passed the pedicle all tapped with a 5.5 mm screws and placed 6.5 mm screws at L3-L4 and L5 bilaterally. We then decorticated the far lateral region and did a posterolateral fusion with autologous bone morselized allograft. We then chose appropriate length rods and secured to the top of the screws and did tightening and final tightening with torque and counter torque and compressed the superior screw down towards the inferior screw at both levels bilaterally. Final fluoroscopy in AP lateral direction looked excellent. We then used a large mass irrigation controlled any bleeding with upper coagulation Gelfoam. Left an epidural drain in the epidural  space and brought out through a separate stab incision. The was then closed in multiple layers of Vicryl on the muscle fascia subcutaneous and subcuticular tissues.  Dermabond Steri-Strips were placed on the skin. Shortness was then applied the patient was extubated and taken to recovery room in stable condition.

## 2012-09-02 NOTE — Anesthesia Procedure Notes (Addendum)
Procedure Name: Intubation Date/Time: 09/02/2012 7:49 AM Performed by: Wray Kearns A Pre-anesthesia Checklist: Patient identified, Timeout performed, Emergency Drugs available, Suction available and Patient being monitored Patient Re-evaluated:Patient Re-evaluated prior to inductionOxygen Delivery Method: Circle system utilized Preoxygenation: Pre-oxygenation with 100% oxygen Intubation Type: IV induction and Cricoid Pressure applied Ventilation: Mask ventilation without difficulty Laryngoscope Size: Mac Grade View: Grade I Tube type: Oral Tube size: 7.5 mm Number of attempts: 2 Airway Equipment and Method: Stylet and Video-laryngoscopy Placement Confirmation: ETT inserted through vocal cords under direct vision,  positive ETCO2,  CO2 detector and breath sounds checked- equal and bilateral Secured at: 21 cm Tube secured with: Tape Dental Injury: Teeth and Oropharynx as per pre-operative assessment  Difficulty Due To: Difficulty was unanticipated, Difficult Airway- due to reduced neck mobility, Difficult Airway- due to anterior larynx and Difficult Airway- due to dentition   Performed by: Wray Kearns A Comments: Initial Laryngoscopy-Intubation in Esophagus, tube removed, and switch to Glydescope with success. SaO2 100% throughout induction, and Pt easy mask ventilation.

## 2012-09-02 NOTE — Transfer of Care (Signed)
Immediate Anesthesia Transfer of Care Note  Patient: Megan Fleming  Procedure(s) Performed: Procedure(s) (LRB) with comments: POSTERIOR LUMBAR FUSION 2 LEVEL (N/A) - lumbar three-four,four five posterior lumbar interbody fusion   Patient Location: PACU  Anesthesia Type:General  Level of Consciousness: awake, alert , oriented and patient cooperative  Airway & Oxygen Therapy: Patient Spontanous Breathing and Patient connected to nasal cannula oxygen  Post-op Assessment: Report given to PACU RN, Post -op Vital signs reviewed and stable and Patient moving all extremities  Post vital signs: Reviewed and stable  Complications: No apparent anesthesia complications

## 2012-09-03 LAB — GLUCOSE, CAPILLARY
Glucose-Capillary: 116 mg/dL — ABNORMAL HIGH (ref 70–99)
Glucose-Capillary: 124 mg/dL — ABNORMAL HIGH (ref 70–99)

## 2012-09-03 MED ORDER — MANAGING BACK PAIN BOOK
Freq: Once | Status: AC
  Administered 2012-09-03: 06:00:00
  Filled 2012-09-03: qty 1

## 2012-09-03 NOTE — Evaluation (Signed)
Occupational Therapy Evaluation Patient Details Name: Megan Fleming MRN: [id number] DOB: [birth year] Today's Date: 09/03/2012 Time: 4098-1191 OT Time Calculation (min): 25 min  OT Assessment / Plan / Recommendation Clinical Impression  Pt s/p PLF L3-5. Will benefit from acute OT services to address below problem list in prep for return home with husband.    OT Assessment  Patient needs continued OT Services    Follow Up Recommendations  No OT follow up;Supervision/Assistance - 24 hour    Barriers to Discharge None    Equipment Recommendations  3 in 1 bedside comode    Recommendations for Other Services    Frequency  Min 2X/week    Precautions / Restrictions Precautions Precautions: Back Precaution Booklet Issued: Yes (comment) Precaution Comments: pt educated on 3/3 back precautions Required Braces or Orthoses: Spinal Brace Spinal Brace: Lumbar corset;Applied in sitting position Restrictions Weight Bearing Restrictions: No   Pertinent Vitals/Pain See vitals    ADL  Eating/Feeding: Performed;Independent Where Assessed - Eating/Feeding: Edge of bed Grooming: Performed;Wash/dry hands;Min guard Where Assessed - Grooming: Unsupported standing Lower Body Bathing: Simulated;Moderate assistance Where Assessed - Lower Body Bathing: Supported sit to stand Lower Body Dressing: Performed;Moderate assistance Where Assessed - Lower Body Dressing: Supported sit to Pharmacist, hospital: Performed;Minimal Dentist Method: Sit to Barista: Comfort height toilet;Grab bars Toileting - Architect and Hygiene: Performed;Minimal assistance Where Assessed - Engineer, mining and Hygiene: Sit to stand from 3-in-1 or toilet Equipment Used: Back brace;Gait belt;Rolling walker Transfers/Ambulation Related to ADLs: Min assist with HHA x1 during ambulation.  Min guard with RW for ambulation.   ADL Comments: Pt donned back brace  with min assist and verbal cueing for technique and proper positioning.  Pt unable to cross ankles over knees.     OT Diagnosis: Generalized weakness;Acute pain  OT Problem List: Decreased strength;Decreased activity tolerance;Decreased knowledge of use of DME or AE;Decreased knowledge of precautions;Pain OT Treatment Interventions: Self-care/ADL training;DME and/or AE instruction;Therapeutic activities;Patient/family education   OT Goals Acute Rehab OT Goals OT Goal Formulation: With patient/family Time For Goal Achievement: 09/17/12 Potential to Achieve Goals: Good ADL Goals Pt Will Perform Lower Body Bathing: with supervision;Sit to stand from chair;Sit to stand from bed;with adaptive equipment ADL Goal: Lower Body Bathing - Progress: Goal set today Pt Will Perform Lower Body Dressing: with supervision;Sit to stand from chair;Sit to stand from bed;with adaptive equipment ADL Goal: Lower Body Dressing - Progress: Goal set today Pt Will Transfer to Toilet: with supervision;Ambulation;with DME;Comfort height toilet;Maintaining back safety precautions ADL Goal: Toilet Transfer - Progress: Goal set today Pt Will Perform Toileting - Clothing Manipulation: with supervision;Standing;with adaptive equipment ADL Goal: Toileting - Clothing Manipulation - Progress: Goal set today Pt Will Perform Toileting - Hygiene: with supervision;Sit to stand from 3-in-1/toilet;with adaptive equipment ADL Goal: Toileting - Hygiene - Progress: Goal set today Pt Will Perform Tub/Shower Transfer: Tub transfer;with supervision;Ambulation;with DME;Shower seat with back;Maintaining back safety precautions ADL Goal: Tub/Shower Transfer - Progress: Goal set today Miscellaneous OT Goals Miscellaneous OT Goal #1: Pt will perform bed mobility at supervision level as precursor for EOB ADLs. OT Goal: Miscellaneous Goal #1 - Progress: Goal set today  Visit Information  Last OT Received On: 09/03/12 Assistance Needed:  +1 PT/OT Co-Evaluation/Treatment: Yes    Subjective Data      Prior Functioning     Home Living Lives With: Spouse Available Help at Discharge: Family;Available 24 hours/day Type of Home: House Home Access: Stairs to enter [employer] of  Steps: 2 Entrance Stairs-Rails: None Home Layout: One level Bathroom Shower/Tub: Tub/shower unit;Curtain Firefighter: Standard Bathroom Accessibility: Yes How Accessible: Accessible via walker Home Adaptive Equipment: Straight cane;Shower chair with back Prior Function Level of Independence: Independent Able to Take Stairs?: Yes Driving: Yes Vocation: Retired Musician: HOH (has hearing aids, reads lips well) Dominant Hand: Right         Vision/Perception     Cognition  Overall Cognitive Status: Appears within functional limits for tasks assessed/performed Arousal/Alertness: Awake/alert Orientation Level: Appears intact for tasks assessed Behavior During Session: [facility] for tasks performed    Extremity/Trunk Assessment Right Upper Extremity Assessment RUE ROM/Strength/Tone: [facility] for tasks assessed Left Upper Extremity Assessment LUE ROM/Strength/Tone: WFL for tasks assessed Right Lower Extremity Assessment RLE ROM/Strength/Tone: Within functional levels RLE Sensation: WFL - Light Touch Left Lower Extremity Assessment LLE ROM/Strength/Tone: Within functional levels LLE Sensation: WFL - Light Touch     Mobility Bed Mobility Bed Mobility: Not assessed Transfers Transfers: Sit to Stand;Stand to Sit Sit to Stand: 4: Min assist;With upper extremity assist;From bed;From toilet Stand to Sit: 4: Min guard;With upper extremity assist;To bed;To toilet Details for Transfer Assistance: Min assist for support and stability. Cues for safe hand placement and safety standing to/from RW     Shoulder Instructions     Exercise     Balance     End of Session OT - End of Session Equipment Utilized During  Treatment: Gait belt;Back brace Activity Tolerance: Patient tolerated treatment well Patient left: in bed;with call bell/phone within reach;with family/visitor present;in chair (sitting EOB) Nurse Communication: Mobility status  GO    09/03/2012 Cipriano Mile OTR/L Pager [phone] Office [phone]  Cipriano Mile 09/03/2012, 2:07 PM

## 2012-09-03 NOTE — Evaluation (Signed)
Physical Therapy Evaluation Patient Details Name: Megan Fleming MRN: [id number] DOB: April 05, 1946 Today's Date: 09/03/2012 Time: 4098-1191 PT Time Calculation (min): 24 min  PT Assessment / Plan / Recommendation Clinical Impression  Pt s/p PLF L3-5. Pt will benefit from skilled PT in the acute care setting in order to maximize functional mobility, and safety prior to d/c home with husband    PT Assessment  Patient needs continued PT services    Follow Up Recommendations  No PT follow up;Supervision for mobility/OOB    Does the patient have the potential to tolerate intense rehabilitation      Barriers to Discharge        Equipment Recommendations  Rolling walker with 5" wheels    Recommendations for Other Services     Frequency Min 5X/week    Precautions / Restrictions Precautions Precautions: Back Precaution Booklet Issued: Yes (comment) Precaution Comments: pt educated on 3/3 back precautions Required Braces or Orthoses: Spinal Brace Spinal Brace: Lumbar corset;Applied in sitting position Restrictions Weight Bearing Restrictions: No   Pertinent Vitals/Pain Back pain 3/10. Pain meds given prior to session       Mobility  Bed Mobility Bed Mobility: Not assessed Transfers Transfers: Sit to Stand;Stand to Sit Sit to Stand: 4: Min assist;With upper extremity assist;From bed;From toilet Stand to Sit: 4: Min guard;With upper extremity assist;To bed;To toilet Details for Transfer Assistance: Min assist for support and stability. Cues for safe hand placement and safety standing to/from RW Ambulation/Gait Ambulation/Gait Assistance: 4: Min guard Ambulation Distance (Feet): 100 Feet Assistive device: Rolling walker Ambulation/Gait Assistance Details: Minguard assist for stability. Cues for safe technique with RW as well as close distance.  Gait Pattern: Step-to pattern;Trunk flexed Gait velocity: slow gait speed Stairs: No    Shoulder Instructions     Exercises      PT Diagnosis: Difficulty walking;Acute pain  PT Problem List: Decreased activity tolerance;Decreased mobility;Decreased knowledge of use of DME;Decreased safety awareness;Decreased knowledge of precautions;Pain PT Treatment Interventions: DME instruction;Gait training;Stair training;Functional mobility training;Therapeutic activities;Patient/family education   PT Goals Acute Rehab PT Goals PT Goal Formulation: With patient Time For Goal Achievement: 09/10/12 Potential to Achieve Goals: Fair Pt will go Supine/Side to Sit: with modified independence PT Goal: Supine/Side to Sit - Progress: Goal set today Pt will go Sit to Supine/Side: with modified independence PT Goal: Sit to Supine/Side - Progress: Goal set today Pt will go Sit to Stand: with modified independence PT Goal: Sit to Stand - Progress: Goal set today Pt will go Stand to Sit: with modified independence PT Goal: Stand to Sit - Progress: Goal set today Pt will Transfer Bed to Chair/Chair to Bed: with supervision PT Transfer Goal: Bed to Chair/Chair to Bed - Progress: Goal set today Pt will Ambulate: >150 feet;with modified independence;with least restrictive assistive device PT Goal: Ambulate - Progress: Goal set today Pt will Go Up / Down Stairs: 1-2 stairs;with min assist;with least restrictive assistive device PT Goal: Up/Down Stairs - Progress: Goal set today  Visit Information  Last PT Received On: 09/03/12 Assistance Needed: +1 PT/OT Co-Evaluation/Treatment: Yes    Subjective Data  Patient Stated Goal: no goal stated   Prior Functioning  Home Living Lives With: Spouse Available Help at Discharge: Family;Available 24 hours/day Type of Home: House Home Access: Stairs to enter [employer] of Steps: 2 Entrance Stairs-Rails: None Home Layout: One level Bathroom Shower/Tub: Forensic scientist: Standard Bathroom Accessibility: Yes How Accessible: Accessible via walker Home  Adaptive Equipment: Straight cane;Shower chair with  back Prior Function Level of Independence: Independent Able to Take Stairs?: Yes Driving: Yes Vocation: Retired Musician: HOH (has hearing aids, reads lips well) Dominant Hand: Right    Cognition  Overall Cognitive Status: Appears within functional limits for tasks assessed/performed Arousal/Alertness: Awake/alert Orientation Level: Appears intact for tasks assessed Behavior During Session: [facility] for tasks performed    Extremity/Trunk Assessment Right Lower Extremity Assessment RLE ROM/Strength/Tone: Within functional levels RLE Sensation: WFL - Light Touch Left Lower Extremity Assessment LLE ROM/Strength/Tone: Within functional levels LLE Sensation: WFL - Light Touch   Balance    End of Session PT - End of Session Equipment Utilized During Treatment: Gait belt;Back brace Activity Tolerance: Patient tolerated treatment well Patient left: in bed;with call bell/phone within reach (sitting at EOB) Nurse Communication: Mobility status  GP     [name] 09/03/2012, 1:51 PM  09/03/2012 [name] DPT PAGER: [phone] OFFICE: [phone]

## 2012-09-03 NOTE — Progress Notes (Signed)
Patient ID: Megan Fleming, female   DOB: [birth year], 67 y.o.   MRN: [id number] Subjective:  The patient is alert and pleasant. She looks well.  Objective: Vital signs in last 24 hours: Temp:  [97.4 F (36.3 C)-98.3 F (36.8 C)] 98.3 F (36.8 C) (01/25 0609) Pulse Rate:  [74-86] 80  (01/25 0609) Resp:  [14-19] 18  (01/25 0609) BP: (103-126)/(47-86) 110/53 mmHg (01/25 0609) SpO2:  [82 %-95 %] 82 % (01/25 0609)  Intake/Output from previous day: 01/24 0701 - 01/25 0700 In: 5198.8 [P.O.:1160; I.V.:3538.8; Blood:100; IV Piggyback:300] Out: 2350 [Urine:1650; Drains:350; Blood:350] Intake/Output this shift:    Physical exam the patient is alert and oriented. Her lower extremity strength is grossly normal.  Lab Results: No results found for this basename: WBC:2,HGB:2,HCT:2,PLT:2 in the last 72 hours BMET No results found for this basename: NA:2,K:2,CL:2,CO2:2,GLUCOSE:2,BUN:2,CREATININE:2,CALCIUM:2 in the last 72 hours  Studies/Results: Dg Lumbar Spine 2-3 Views  09/02/2012  *RADIOLOGY REPORT*  Clinical Data: Lumbar fusion  DG C-ARM GT 120 MIN,LUMBAR SPINE - 2-3 VIEW  Comparison: MR 07/20/2012  Findings: Two spot images from intraoperative C-arm fluoroscopy document changes of instrumented PLIF L3-L5.  The L3 pedicle screws project   to the posterior third of the vertebral body.  Graft markers project in the L3-4 and L4-5 interspaces.  IMPRESSION:  1.  P L I F L3-L5 as above.   Original Report Authenticated By: D. Andria Rhein, MD    Dg C-arm Gt 120 Min  09/02/2012  *RADIOLOGY REPORT*  Clinical Data: Lumbar fusion  DG C-ARM GT 120 MIN,LUMBAR SPINE - 2-3 VIEW  Comparison: MR 07/20/2012  Findings: Two spot images from intraoperative C-arm fluoroscopy document changes of instrumented PLIF L3-L5.  The L3 pedicle screws project   to the posterior third of the vertebral body.  Graft markers project in the L3-4 and L4-5 interspaces.  IMPRESSION:  1.  P L I F L3-L5 as above.   Original Report  Authenticated By: D. Andria Rhein, MD     Assessment/Plan: Postop day 1: The patient is doing well. We will mobilize her.  LOS: 1 day     Tylisha Danis D 09/03/2012, 9:10 AM

## 2012-09-04 LAB — GLUCOSE, CAPILLARY
Glucose-Capillary: 113 mg/dL — ABNORMAL HIGH (ref 70–99)
Glucose-Capillary: 93 mg/dL (ref 70–99)
Glucose-Capillary: 125 mg/dL — ABNORMAL HIGH (ref 70–99)

## 2012-09-04 NOTE — Progress Notes (Signed)
Subjective: Patient reports She's feeling great back pain is well controlled no leg pain ambulating well  Objective: Vital signs in last 24 hours: Temp:  [97.7 F (36.5 C)-98.4 F (36.9 C)] 98.1 F (36.7 C) (01/26 0930) Pulse Rate:  [69-84] 84  (01/26 0930) Resp:  [17-18] 17  (01/26 0930) BP: (95-130)/(50-116) 115/71 mmHg (01/26 0930) SpO2:  [90 %-98 %] 94 % (01/26 0930)  Intake/Output from previous day: 01/25 0701 - 01/26 0700 In: 720 [P.O.:720] Out: 475 [Urine:200; Drains:275] Intake/Output this shift:    Awake alert oriented strength 5 out of 5 wound clean and dry  Lab Results: No results found for this basename: WBC:2,HGB:2,HCT:2,PLT:2 in the last 72 hours BMET No results found for this basename: NA:2,K:2,CL:2,CO2:2,GLUCOSE:2,BUN:2,CREATININE:2,CALCIUM:2 in the last 72 hours  Studies/Results: Dg Lumbar Spine 2-3 Views  09/02/2012  *RADIOLOGY REPORT*  Clinical Data: Lumbar fusion  DG C-ARM GT 120 MIN,LUMBAR SPINE - 2-3 VIEW  Comparison: MR 07/20/2012  Findings: Two spot images from intraoperative C-arm fluoroscopy document changes of instrumented PLIF L3-L5.  The L3 pedicle screws project   to the posterior third of the vertebral body.  Graft markers project in the L3-4 and L4-5 interspaces.  IMPRESSION:  1.  P L I F L3-L5 as above.   Original Report Authenticated By: D. Andria Rhein, MD    Dg C-arm Gt 120 Min  09/02/2012  *RADIOLOGY REPORT*  Clinical Data: Lumbar fusion  DG C-ARM GT 120 MIN,LUMBAR SPINE - 2-3 VIEW  Comparison: MR 07/20/2012  Findings: Two spot images from intraoperative C-arm fluoroscopy document changes of instrumented PLIF L3-L5.  The L3 pedicle screws project   to the posterior third of the vertebral body.  Graft markers project in the L3-4 and L4-5 interspaces.  IMPRESSION:  1.  P L I F L3-L5 as above.   Original Report Authenticated By: D. Andria Rhein, MD     Assessment/Plan: Continue mobilizes today patient has one more session of therapy Daphine Deutscher work on  stairs and she should be ready for discharge into the [facility] will DC and medially prior to discharge  LOS: 2 days     Kenslei Hearty P 09/04/2012, 10:08 AM

## 2012-09-04 NOTE — Progress Notes (Addendum)
Occupational Therapy Treatment Patient Details Name: Megan Fleming MRN: [id number] DOB: [birth year] Today's Date: 09/04/2012 Time: 1478-2956 OT Time Calculation (min): 24 min  OT Assessment / Plan / Recommendation Comments on Treatment Session Pt progressing toward goals.    Follow Up Recommendations  No OT follow up;Supervision/Assistance - 24 hour    Barriers to Discharge       Equipment Recommendations  3 in 1 bedside comode    Recommendations for Other Services    Frequency Min 2X/week   Plan Discharge plan remains appropriate    Precautions / Restrictions Precautions Precautions: Back Precaution Booklet Issued: Yes (comment) Precaution Comments: Pt independently verbalized 3/3 back precautions. Required Braces or Orthoses: Spinal Brace Spinal Brace: Lumbar corset;Applied in sitting position   Pertinent Vitals/Pain See vitals    ADL  Grooming: Performed;Wash/dry hands;Supervision/safety Where Assessed - Grooming: Unsupported standing Lower Body Bathing: Simulated;Supervision/safety Where Assessed - Lower Body Bathing: Unsupported sit to stand Lower Body Dressing: Performed;Minimal assistance Where Assessed - Lower Body Dressing: Unsupported sit to stand Toilet Transfer: Performed;Supervision/safety Toilet Transfer Method: Sit to Barista: Comfort height toilet Toileting - Clothing Manipulation and Hygiene: Performed;Supervision/safety Where Assessed - Engineer, mining and Hygiene: Sit to stand from 3-in-1 or toilet Tub/Shower Transfer: Performed;Min guard Tub/Shower Transfer Method: Science writer: Shower seat with back Equipment Used: Back brace;Rolling walker;Reacher;Long-handled sponge Transfers/Ambulation Related to ADLs: supervision with RW ADL Comments: Educated pt on use of reacher and toilet aid (open faced tongs) to maximize independence with ADLs.  Pt reports she will use her reacher for  undergarment and pants but prefers to have her husband assist with socks and shoes.  Pt performed tub transfer with min guard for safety and increased time.  Pt side steps over tub ledge leading with LLE (stronger LE).  Has a shower chair at home.    OT Diagnosis:    OT Problem List:   OT Treatment Interventions:     OT Goals ADL Goals Pt Will Perform Lower Body Bathing: with supervision;Sit to stand from chair;Sit to stand from bed;with adaptive equipment ADL Goal: Lower Body Bathing - Progress: Goal set today Pt Will Perform Lower Body Dressing: with supervision;Sit to stand from chair;Sit to stand from bed;with adaptive equipment ADL Goal: Lower Body Dressing - Progress: Discontinued (comment) (min assist only for socks which husband will assist with) Pt Will Transfer to Toilet: with supervision;Ambulation;with DME;Comfort height toilet;Maintaining back safety precautions ADL Goal: Toilet Transfer - Progress: Met Pt Will Perform Toileting - Clothing Manipulation: with supervision;Standing;with adaptive equipment ADL Goal: Toileting - Clothing Manipulation - Progress: Met Pt Will Perform Toileting - Hygiene: with supervision;Sit to stand from 3-in-1/toilet;with adaptive equipment ADL Goal: Toileting - Hygiene - Progress: Met Pt Will Perform Tub/Shower Transfer: Tub transfer;with supervision;Ambulation;with DME;Shower seat with back;Maintaining back safety precautions ADL Goal: Tub/Shower Transfer - Progress: Progressing toward goals  Visit Information  Last OT Received On: 09/04/12 Assistance Needed: +1    Subjective Data      Prior Functioning       Cognition  Overall Cognitive Status: Appears within functional limits for tasks assessed/performed Arousal/Alertness: Awake/alert Orientation Level: Appears intact for tasks assessed Behavior During Session: [facility] for tasks performed    Mobility  Shoulder Instructions Transfers Transfers: Sit to Stand;Stand to Sit Sit to Stand:  5: Supervision;From toilet;From chair/3-in-1;With armrests;With upper extremity assist Stand to Sit: 5: Supervision;With upper extremity assist;With armrests;To chair/3-in-1 Details for Transfer Assistance: Occasional VC for safe hand placement.  Exercises      Balance     End of Session OT - End of Session Equipment Utilized During Treatment: Back brace Activity Tolerance: Patient tolerated treatment well Patient left: Other (comment) (with PT in hall) Nurse Communication: Mobility status  GO   09/04/2012 Cipriano Mile OTR/L Pager [phone] Office [phone]  Cipriano Mile 09/04/2012, 9:08 AM

## 2012-09-04 NOTE — Progress Notes (Signed)
Physical Therapy Treatment Patient Details Name: Megan Fleming MRN: [id number] DOB: 24-Nov-1945 Today's Date: 09/04/2012 Time: 9562-1308 PT Time Calculation (min): 19 min  PT Assessment / Plan / Recommendation Comments on Treatment Session  Pt progressing well. She is at a supervision level for all bed mobility and ambulation. Continue per plan, practice stairs as well as family education for d/c home tomorrow    Follow Up Recommendations  No PT follow up;Supervision for mobility/OOB     Does the patient have the potential to tolerate intense rehabilitation     Barriers to Discharge        Equipment Recommendations  Rolling walker with 5" wheels    Recommendations for Other Services    Frequency Min 5X/week   Plan Discharge plan remains appropriate;Frequency remains appropriate    Precautions / Restrictions Precautions Precautions: Back Precaution Booklet Issued: Yes (comment) Precaution Comments: pt educated on 3/3 back precautions Required Braces or Orthoses: Spinal Brace Spinal Brace: Lumbar corset;Applied in sitting position   Pertinent Vitals/Pain Pain 1/10. RN aware.    Mobility  Bed Mobility Bed Mobility: Rolling Left;Left Sidelying to Sit;Sitting - Scoot to Edge of Bed;Sit to Sidelying Left Rolling Left: 5: Supervision Left Sidelying to Sit: 5: Supervision Sitting - Scoot to Edge of Bed: 5: Supervision Sit to Sidelying Left: 5: Supervision Details for Bed Mobility Assistance: VC for safe technique throughout bed mobility. No physical assistance needed Transfers Transfers: Sit to Stand;Stand to Sit Sit to Stand: 5: Supervision;From toilet;From chair/3-in-1;With armrests;With upper extremity assist Stand to Sit: 5: Supervision;With upper extremity assist;With armrests;To chair/3-in-1 Details for Transfer Assistance: Occasional VC for safe hand placement. Ambulation/Gait Ambulation/Gait Assistance: 5: Supervision Ambulation Distance (Feet): 150 Feet Assistive  device: Rolling walker Ambulation/Gait Assistance Details: Supervision for safety. Cues for upright posture and close distance to RW as pt in flexed posture. Step to gait pattern Gait Pattern: Step-to pattern;Trunk flexed Gait velocity: slow gait speed Stairs: Yes Stairs Assistance: 4: Min assist Stairs Assistance Details (indicate cue type and reason): VC for safe technique with RW. Cues to lead with strong leg and descend with weak Stair Management Technique: No rails;Step to pattern;Backwards;With walker Number of Stairs: 2     Exercises     PT Diagnosis:    PT Problem List:   PT Treatment Interventions:     PT Goals Acute Rehab PT Goals PT Goal Formulation: With patient PT Goal: Supine/Side to Sit - Progress: Progressing toward goal PT Goal: Sit to Supine/Side - Progress: Progressing toward goal PT Goal: Sit to Stand - Progress: Progressing toward goal PT Goal: Stand to Sit - Progress: Progressing toward goal PT Transfer Goal: Bed to Chair/Chair to Bed - Progress: Progressing toward goal PT Goal: Ambulate - Progress: Progressing toward goal PT Goal: Up/Down Stairs - Progress: Met  Visit Information  Last PT Received On: 09/04/12 Assistance Needed: +1    Subjective Data      Cognition  Overall Cognitive Status: Appears within functional limits for tasks assessed/performed Arousal/Alertness: Awake/alert Orientation Level: Appears intact for tasks assessed Behavior During Session: [facility] for tasks performed    Balance     End of Session PT - End of Session Equipment Utilized During Treatment: Gait belt;Back brace Activity Tolerance: Patient tolerated treatment well Patient left: in chair;with call bell/phone within reach;with family/visitor present Nurse Communication: Mobility status   GP     Megan Fleming 09/04/2012, 9:36 AM

## 2012-09-05 LAB — GLUCOSE, CAPILLARY: Glucose-Capillary: 118 mg/dL — ABNORMAL HIGH (ref 70–99)

## 2012-09-05 MED ORDER — HYDROCODONE-ACETAMINOPHEN 5-325 MG PO TABS: 1.0000 | ORAL_TABLET | ORAL | Status: AC | PRN

## 2012-09-05 MED FILL — Sodium Chloride IV Soln 0.9%: INTRAVENOUS | Qty: 2000 | Status: AC

## 2012-09-05 MED FILL — Heparin Sodium (Porcine) Inj 1000 Unit/ML: INTRAMUSCULAR | Qty: 30 | Status: AC

## 2012-09-05 NOTE — Progress Notes (Signed)
Physical Therapy Treatment Patient Details Name: Megan Fleming MRN: [id number] DOB: 08-14-1945 Today's Date: 09/05/2012 Time: 4098-1191 PT Time Calculation (min): 23 min  PT Assessment / Plan / Recommendation Comments on Treatment Session  Pt cont's to demonstrate safe technique with mobility & is at an appropriate level to safely d/c home when MD feels medically ready.      Follow Up Recommendations  No PT follow up;Supervision for mobility/OOB     Does the patient have the potential to tolerate intense rehabilitation     Barriers to Discharge        Equipment Recommendations  Rolling walker with 5" wheels    Recommendations for Other Services    Frequency Min 5X/week   Plan Discharge plan remains appropriate;Frequency remains appropriate    Precautions / Restrictions Precautions Precautions: Back Precaution Comments: Pt able to recall 3/3 back precautions Required Braces or Orthoses: Spinal Brace Spinal Brace: Lumbar corset;Applied in sitting position Restrictions Weight Bearing Restrictions: No       Mobility  Bed Mobility Bed Mobility: Not assessed Transfers Transfers: Sit to Stand;Stand to Sit Sit to Stand: 6: Modified independent (Device/Increase time);With upper extremity assist;With armrests;From chair/3-in-1;From toilet Stand to Sit: 6: Modified independent (Device/Increase time);With upper extremity assist;With armrests;To chair/3-in-1;To toilet Ambulation/Gait Ambulation/Gait Assistance: 5: Supervision Ambulation Distance (Feet): 300 Feet Assistive device: Rolling walker Ambulation/Gait Assistance Details: Pt progressing with fluidity of gait pattern.  Did well with proximity of RW + body.   Gait Pattern: Step-through pattern;Decreased stride length Stairs: Yes Stairs Assistance: 4: Min assist Stairs Assistance Details (indicate cue type and reason): Pt able to return demonstration with backwards technique requiring only min cueing for RW placement.     Stair Management Technique: No rails;Step to pattern;Backwards;With walker Number of Stairs: 2  Wheelchair Mobility Wheelchair Mobility: No      PT Goals Acute Rehab PT Goals Time For Goal Achievement: 09/10/12 Potential to Achieve Goals: Fair Pt will go Supine/Side to Sit: with modified independence Pt will go Sit to Supine/Side: with modified independence Pt will go Sit to Stand: with modified independence PT Goal: Sit to Stand - Progress: Met Pt will go Stand to Sit: with modified independence PT Goal: Stand to Sit - Progress: Met Pt will Transfer Bed to Chair/Chair to Bed: with supervision Pt will Ambulate: >150 feet;with modified independence;with least restrictive assistive device PT Goal: Ambulate - Progress: Progressing toward goal Pt will Go Up / Down Stairs: 1-2 stairs;with min assist;with least restrictive assistive device PT Goal: Up/Down Stairs - Progress: Met  Visit Information  Last PT Received On: 09/05/12 Assistance Needed: +1    Subjective Data      Cognition  Overall Cognitive Status: Appears within functional limits for tasks assessed/performed Arousal/Alertness: Awake/alert Orientation Level: Appears intact for tasks assessed Behavior During Session: [facility] for tasks performed    Balance     End of Session PT - End of Session Equipment Utilized During Treatment: Gait belt;Back brace Activity Tolerance: Patient tolerated treatment well Patient left: in chair;with call bell/phone within reach Nurse Communication: Mobility status     Verdell Face, [place] [phone] 09/05/2012

## 2012-09-05 NOTE — Care Management Note (Signed)
    Page 1 of 1   09/05/2012     4:34:21 PM   CARE MANAGEMENT NOTE 09/05/2012  Patient:  Megan Fleming, Megan Fleming   Account Number:  [id number]  Date Initiated:  09/05/2012  Documentation initiated by:  Jacquelynn Cree  Subjective/Objective Assessment:   admit postop L3-4, L4-5 PLIF     Action/Plan:   PT/OT evals- no PT/OT followup recommended , recommended rolling walker and 3N1   Anticipated DC Date:  09/05/2012   Anticipated DC Plan:  HOME/SELF CARE      DC Planning Services  CM consult      Choice offered to / List presented to:     DME arranged  3-N-1  WALKER - ROLLING           Status of service:  Completed, signed off Medicare Important Message given?   (If response is "NO", the following Medicare IM given date fields will be blank) Date Medicare IM given:   Date Additional Medicare IM given:    Discharge Disposition:  HOME/SELF CARE  Per UR Regulation:  Reviewed for med. necessity/level of care/duration of stay  If discussed at Long Length of Stay Meetings, dates discussed:    Comments:

## 2012-09-05 NOTE — Discharge Summary (Signed)
Physician Discharge Summary  Patient ID: Megan Fleming MRN: [id number] DOB/AGE: [birth year] 66 y.o.  Admit date: 09/02/2012 Discharge date: 09/05/2012  Admission Diagnoses:  Discharge Diagnoses:  Active Problems:  * No active hospital problems. *    Discharged Condition: good  Hospital Course: Surgery Friday. Did well. Marked improvement in pain. Wound healed fine. Increased activity well. Home POD 3, pain markedly improved. Specific instructions given.  Consults: None  Significant Diagnostic Studies: none  Treatments: surgery: L 34 L 45 plif with screws  Discharge Exam: Blood pressure 118/52, pulse 86, temperature 98 F (36.7 C), temperature source Oral, resp. rate 20, height 5\' 3"  (1.6 m), weight 80.287 kg (177 lb), SpO2 96.00%. Incision/Wound:healing well  Disposition: Final discharge disposition not confirmed  Discharge Orders    Future Orders Please Complete By Expires   Diet general      Discharge instructions      Comments:   Mostly bedrest. Get up 9 or 10 times each day and walk for 15-20 minutes each time. Very little sitting the first week. No riding in the car until your first post op appointment. If you had neck surgery...may shower from the chest down. If you had low back surgery....you may shower with a saran wrap covering over the incision. Take your pain medicine as needed...and other medicines that you are instructed to take. Call for an appointment...[phone].   Call MD for:  temperature >100.4      Call MD for:  persistant nausea and vomiting      Call MD for:  severe uncontrolled pain      Call MD for:  redness, tenderness, or signs of infection (pain, swelling, redness, odor or green/yellow discharge around incision site)      Call MD for:  difficulty breathing, headache or visual disturbances      Call MD for:  hives          Medication List     As of 09/05/2012 12:44 PM    STOP taking these medications         aspirin 325 MG tablet     cephALEXin 500 MG capsule   Commonly known as: KEFLEX      TAKE these medications         albuterol 108 (90 BASE) MCG/ACT inhaler   Commonly known as: PROVENTIL HFA;VENTOLIN HFA   Inhale 2 puffs into the lungs every 4 (four) hours as needed. For wheezing      budesonide-formoterol 160-4.5 MCG/ACT inhaler   Commonly known as: SYMBICORT   Inhale 2 puffs into the lungs 2 (two) times daily.      CALCIUM + D PO   Take 1 tablet by mouth daily.      conjugated estrogens vaginal cream   Commonly known as: PREMARIN   Place 0.5 g vaginally 2 (two) times a week. Uses on Mon and Fri      fluticasone 27.5 MCG/SPRAY nasal spray   Commonly known as: VERAMYST   Place 2 sprays into the nose at bedtime.      gemfibrozil 600 MG tablet   Commonly known as: LOPID   Take 600 mg by mouth 2 (two) times daily before a meal.      guaiFENesin 600 MG 12 hr tablet   Commonly known as: MUCINEX   Take 600 mg by mouth daily.      HYDROcodone-acetaminophen 5-325 MG per tablet   Commonly known as: NORCO/VICODIN   Take 1-2 tablets by mouth every 4 (four) hours  as needed.      ibandronate 150 MG tablet   Commonly known as: BONIVA   Take 150 mg by mouth every 30 (thirty) days. Take in the morning with a full glass of water, on an empty stomach, and do not take anything else by mouth or lie down for the next 30 min. Takes on the 5th of every month.      KRILL OIL OMEGA-3 PO   Take 1 capsule by mouth daily.      levothyroxine 88 MCG tablet   Commonly known as: SYNTHROID, LEVOTHROID   Take 88 mcg by mouth every morning.      lisinopril 10 MG tablet   Commonly known as: PRINIVIL,ZESTRIL   Take 5 mg by mouth daily.      multivitamin with minerals Tabs   Take 1 tablet by mouth daily.      nebivolol 10 MG tablet   Commonly known as: BYSTOLIC   Take 10 mg by mouth daily.      niacin 500 MG CR tablet   Commonly known as: NIASPAN   Take 500 mg by mouth at bedtime.      venlafaxine 75 MG tablet    Commonly known as: EFFEXOR   Take 150 mg by mouth daily.      ASK your doctor about these medications         metFORMIN 500 MG (MOD) 24 hr tablet   Commonly known as: GLUMETZA   Take 500 mg by mouth 2 (two) times daily with a meal.         At home rest most of the time. Get up 9 or 10 times each day and take a 15 or 20 minute walk. No riding in the car and to your first postoperative appointment. If you have neck surgery you may shower from the chest down starting on the third postoperative day. If you had back surgery he may start showering on the third postoperative day with saran wrap wrapped around your incisional area 3 times. After the shower remove the saran wrap. Take pain medicine as needed and other medications as instructed. Call my office for an appointment.  SignedReinaldo Meeker, MD 09/05/2012, 12:44 PM

## 2012-09-07 LAB — GLUCOSE, CAPILLARY: Glucose-Capillary: 88 mg/dL (ref 70–99)

## 2013-02-10 IMAGING — RF DG LUMBAR SPINE 2-3V
1 series · 2 of 2 positions shown · non-contrast
Comparison: MR 07/20/2012

CLINICAL DATA: Lumbar fusion

DG C-ARM GT 120 MIN,LUMBAR SPINE - 2-3 VIEW

[Series 1: run · 2 of 2 slices shown]
[im 1/2]
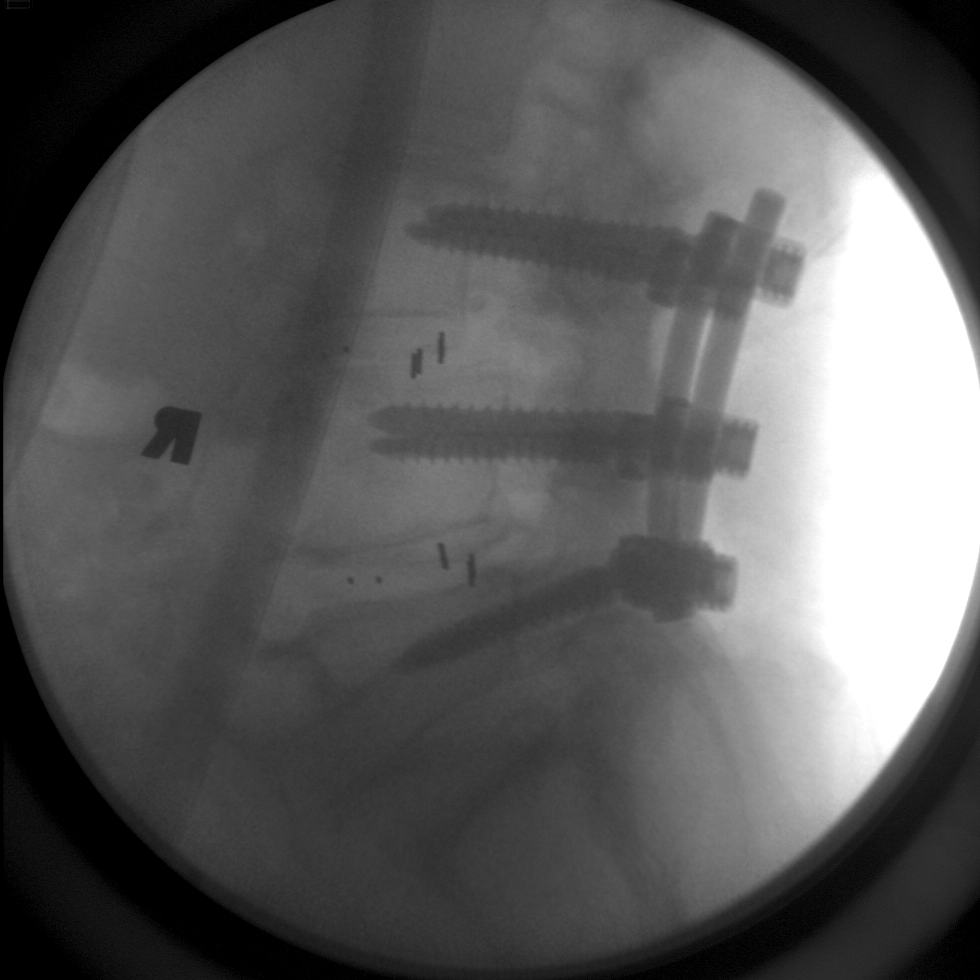
[im 2/2]
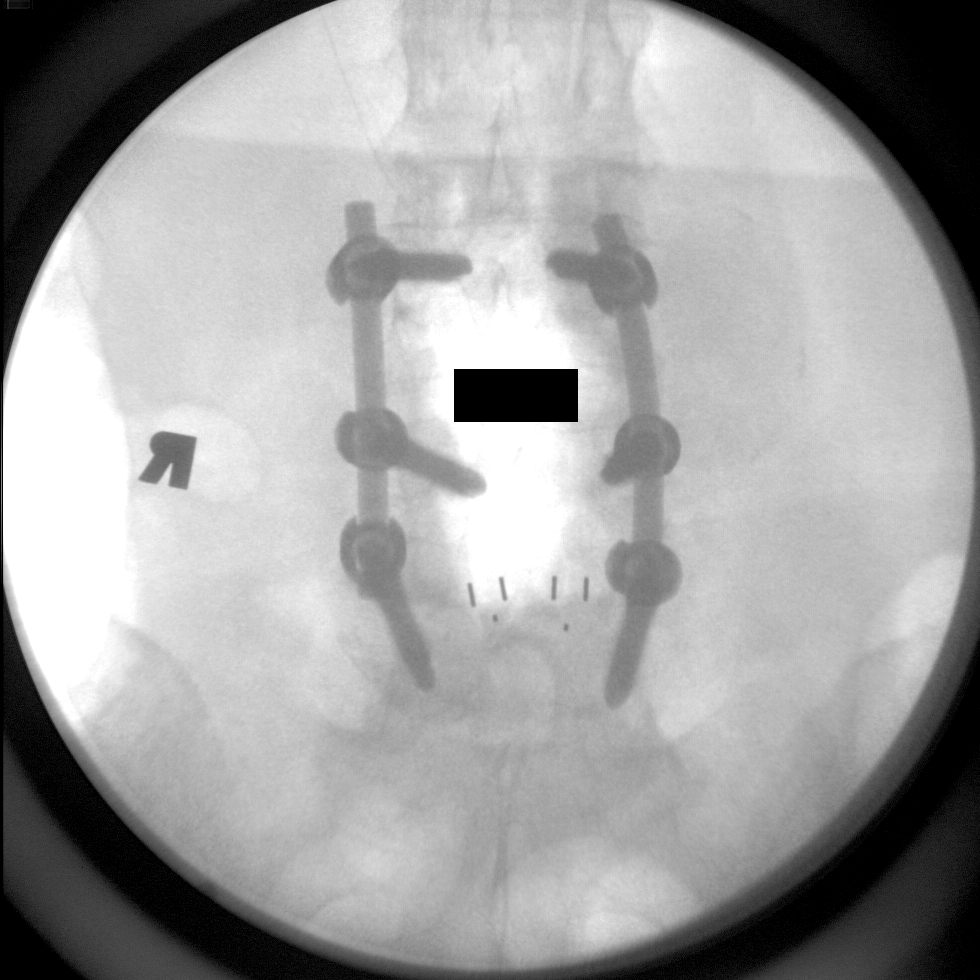

[2 of 2 positions shown; findings below may reference images not displayed]

FINDINGS: Two spot images from intraoperative C-arm fluoroscopy
document changes of instrumented PLIF L3-L5.  The L3 pedicle screws
project   to the posterior third of the vertebral body.  Graft
markers project in the L3-4 and L4-5 interspaces.
IMPRESSION:

## 2013-06-21 DIAGNOSIS — R399 Unspecified symptoms and signs involving the genitourinary system: Secondary | ICD-10-CM | POA: Insufficient documentation

## 2015-04-11 HISTORY — PX: ABDOMINAL AORTIC ANEURYSM REPAIR: SUR1152

## 2015-06-05 ENCOUNTER — Other Ambulatory Visit: Payer: Self-pay | Admitting: Surgery

## 2015-06-05 DIAGNOSIS — IMO0002 Reserved for concepts with insufficient information to code with codable children: Secondary | ICD-10-CM

## 2015-06-05 DIAGNOSIS — T82330A Leakage of aortic (bifurcation) graft (replacement), initial encounter: Secondary | ICD-10-CM

## 2015-06-12 ENCOUNTER — Ambulatory Visit
Admission: RE | Admit: 2015-06-12 | Discharge: 2015-06-12 | Disposition: A | Discharge status: Home / Self Care | Payer: Medicare Other | Source: Ambulatory Visit | Attending: Surgery | Admitting: Surgery

## 2015-06-12 DIAGNOSIS — I729 Aneurysm of unspecified site: Secondary | ICD-10-CM | POA: Insufficient documentation

## 2015-06-12 DIAGNOSIS — IMO0002 Reserved for concepts with insufficient information to code with codable children: Secondary | ICD-10-CM | POA: Insufficient documentation

## 2015-06-12 DIAGNOSIS — J449 Chronic obstructive pulmonary disease, unspecified: Secondary | ICD-10-CM | POA: Insufficient documentation

## 2015-06-12 DIAGNOSIS — M1990 Unspecified osteoarthritis, unspecified site: Secondary | ICD-10-CM | POA: Insufficient documentation

## 2015-06-12 DIAGNOSIS — G629 Polyneuropathy, unspecified: Secondary | ICD-10-CM | POA: Insufficient documentation

## 2015-06-12 DIAGNOSIS — T82330A Leakage of aortic (bifurcation) graft (replacement), initial encounter: Secondary | ICD-10-CM | POA: Insufficient documentation

## 2015-06-12 DIAGNOSIS — H9190 Unspecified hearing loss, unspecified ear: Secondary | ICD-10-CM | POA: Insufficient documentation

## 2015-06-12 DIAGNOSIS — J45909 Unspecified asthma, uncomplicated: Secondary | ICD-10-CM

## 2015-06-12 DIAGNOSIS — E785 Hyperlipidemia, unspecified: Secondary | ICD-10-CM | POA: Insufficient documentation

## 2015-06-12 DIAGNOSIS — E079 Disorder of thyroid, unspecified: Secondary | ICD-10-CM | POA: Insufficient documentation

## 2015-06-12 DIAGNOSIS — M5430 Sciatica, unspecified side: Secondary | ICD-10-CM | POA: Insufficient documentation

## 2015-06-12 DIAGNOSIS — I6529 Occlusion and stenosis of unspecified carotid artery: Secondary | ICD-10-CM | POA: Insufficient documentation

## 2015-06-12 HISTORY — DX: Unspecified asthma, uncomplicated: J45.909

## 2015-06-12 NOTE — Consult Note (Signed)
Chief Complaint: Patient was seen in consultation today for potential percutaneous repair of endoleak following EVAR at the request of Cruz,Nestor Jr.  Referring Physician(s): [employer].  History of Present Illness: Megan Fleming is a 69 y.o. female with past medical history significant for COPD, blood clots (history of pulmonary embolism in 2011, not currently on anticoagulation) and diabetes who underwent a endovascular repair of an infrarenal abdominal aortic aneurysm on 05/08/2015. 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 The patient is unaccompanied and serves as her own historian.  The patient states she has recovered completely from the endovascular abdominal aortic aneurysm repair though stated she did experience postprocedural back pain due to required positioning during the prior intervention (patient has history of prior lumbar spinal fusion.  Patient is currently without complaint. No unintentional weight loss or weight gain.  Past Medical History  Diagnosis Date  . Hypothyroidism   . Hypertension   . Heart murmur   . Shortness of breath     On exertion- followed by pulmonologist-Dr. Greggory Stallion.  Marland Kitchen COPD (chronic obstructive pulmonary disease) ([place])     Empysema  . Aneurysm ([place]) 2011    size=4.1 at Aorta, sees Dr. Ashok Cordia Cruz(vascular)  . Bladder tumor ?2000?    incontinence-? intertistitial cystitis? NOT cancer (bladder biopsy negative)  . Hx of blood clots 12/2009    PE May 2011  . Diabetes mellitus without complication ([place])   . 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 Arthritis     Fingers, left knee, possibly right hip    Past Surgical History  Procedure Laterality Date  . Cesarean  section  [id number]       . Tonsillectomy  1959  . Tubal ligation  1976  . Abdominal aortic aneurysm repair  04/2015    stent placed; repaired left renal artery stenosis with stent as incidental finding    Allergies: Review of patient's allergies indicates no known allergies.  Medications: Prior to Admission medications   Medication Sig Start Date End Date Taking? Authorizing Provider  albuterol (PROVENTIL HFA;VENTOLIN HFA) 108 (90 BASE) MCG/ACT inhaler Inhale 2 puffs into the lungs every 4 (four) hours as needed. For wheezing   Yes Historical Provider, MD  aspirin 325 MG tablet Take 325 mg by mouth.   Yes Historical Provider, MD  conjugated estrogens (PREMARIN) vaginal cream Place 0.5 g vaginally 2 (two) times a week. Uses on Mon and Fri   Yes Historical Provider, MD  DULoxetine (CYMBALTA) 60 MG capsule Take 60 mg by mouth.   Yes Historical Provider, MD  fluticasone (VERAMYST) 27.5 MCG/SPRAY nasal spray Place 2 sprays into the nose at bedtime.   Yes Historical Provider, MD  gemfibrozil (LOPID) 600 MG tablet Take 600 mg by mouth 2 (two) times daily before a meal.   Yes Historical Provider, MD  guaiFENesin (MUCINEX) 600 MG 12 hr tablet Take 600 mg by mouth daily.   Yes Historical Provider, MD  ibandronate (BONIVA) 150 MG tablet Take 150 mg by mouth every 30 (thirty) days. Take in the morning with a full glass of water, on an empty stomach, and do not take anything else by mouth or lie down for the next 30 min. Takes on the 5th of every month.  Yes Historical Provider, MD  KRILL OIL OMEGA-3 PO Take 1 capsule by mouth daily.   Yes Historical Provider, MD  levothyroxine (SYNTHROID, LEVOTHROID) 88 MCG tablet Take 88 mcg by mouth every morning.   Yes Historical Provider, MD  lisinopril (PRINIVIL,ZESTRIL) 10 MG tablet Take 5 mg by mouth daily.   Yes Historical Provider, MD  metFORMIN (GLUMETZA) 500 MG (MOD) 24 hr tablet Take 500 mg by mouth 2 (two) times daily with a meal.   Yes Historical Provider,  MD  Multiple Vitamin (MULTIVITAMIN WITH MINERALS) TABS Take 1 tablet by mouth daily.   Yes Historical Provider, MD  nebivolol (BYSTOLIC) 10 MG tablet Take 10 mg by mouth daily.   Yes Historical Provider, MD  niacin (NIASPAN) 500 MG CR tablet Take 500 mg by mouth at bedtime.   Yes Historical Provider, MD  pentosan polysulfate (ELMIRON) 100 MG capsule 1 CAPSULE EVERY 12 HOURS 02/28/15  Yes Historical Provider, MD  budesonide-formoterol (SYMBICORT) 160-4.5 MCG/ACT inhaler Inhale 2 puffs into the lungs 2 (two) times daily.    Historical Provider, MD  Calcium Carbonate-Vitamin D (CALCIUM + D PO) Take 1 tablet by mouth daily.    Historical Provider, MD  Calcium Carbonate-Vitamin D (CALCIUM-VITAMIN D) 500-200 MG-UNIT tablet Take by mouth.    Historical Provider, MD  HYDROcodone-acetaminophen (NORCO/VICODIN) 5-325 MG per tablet Take 1-2 tablets by mouth every 4 (four) hours as needed. Patient not taking: Reported on 06/12/2015 09/05/12   Karie Chimera, MD  valACYclovir (VALTREX) 1000 MG tablet Take 1,000 mg by mouth.    Historical Provider, MD  venlafaxine (EFFEXOR) 75 MG tablet Take 150 mg by mouth daily.    Historical Provider, MD     No family history on file.  Social History   Social History  . Marital Status: Married    Spouse Name: N/A  . Number of Children: N/A  . Years of Education: N/A   Social History Main Topics  . Smoking status: Former Smoker    Types: Cigarettes    Quit date: 06/26/2003  . Smokeless tobacco: Not on file  . Alcohol Use: No  . Drug Use: Not on file  . Sexual Activity: Not on file   Other Topics Concern  . Not on file   Social History Narrative    ECOG Status: 0 - Asymptomatic  Review of Systems: A 12 point ROS discussed and pertinent positives are indicated in the HPI above.  All other systems are negative.  Review of Systems  Constitutional: Negative.   Respiratory: Negative.   Cardiovascular: Negative.   Musculoskeletal: Positive for back pain.    Psychiatric/Behavioral: Negative.     Vital Signs: BP 150/69 mmHg  Pulse 79  Temp(Src) 98.2 F (36.8 C)  Resp 18  SpO2 95%  Physical Exam  Constitutional: She appears well-nourished.  Cardiovascular: Normal rate, regular rhythm and intact distal pulses.   Easily palpable R CFA, DP and PT pulses  Abdominal:  Obese with abdominal pannus  Psychiatric: She has a normal mood and affect.  Nursing note and vitals reviewed.  Imaging:  CTA of the abdomen and pelvis - 05/31/2015  Significant findings: Type IA endoleak with flow surrounding the sides of the left renal artery snorkel stent with opacification of the native abdominal aortic aneurysm sac with contribution of arterial supply to both the IMA as well as the L3 lumbar artery.   No change to very minimal enlargement of native abdominal aortic aneurysm sac, currently measuring up to 5 cm in diameter, previously,  4.8 cm.  Labs:  CBC: No results for input(s): WBC, HGB, HCT, PLT in the last 8760 hours.  COAGS: No results for input(s): INR, APTT in the last 8760 hours.  BMP: No results for input(s): NA, K, CL, CO2, GLUCOSE, BUN, CALCIUM, CREATININE, GFRNONAA, GFRAA in the last 8760 hours.  Invalid input(s): CMP  Assessment and Plan:  Megan Fleming is a 69 y.o. female with past medical history significant for COPD, blood clots (history of pulmonary embolism in 2011, not currently on anticoagulation) and diabetes who underwent a endovascular repair of an infrarenal abdominal aortic aneurysm on 05/08/2015.   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   Prolonged conversations were held with the patient regarding potential endoleak management strategies  including continued observation and surveillance with a follow-up CT scan to evaluate for both persistence of the endoleak as well as native abdominal aortic aneurysm enlargement versus proceeding with a diagnostic (and potentially therapeutic) mesenteric arteriogram.   I explained to the patient that given the complexity of her endograft repair, particularly the presence of the left renal artery snorkel stent, percutaneous treatment may not be possible though I would attempt to cannulate at least one side of the origin of the type IA endoleak and advance a microcatheter through the patent native abdominal aortic aneurysm sac in attempts of performing antegrade occlusion of the origin of the IMA. I explained to the patient that she lacks a hypertrophied marginal artery of [place] thereby making retrograde passage into the IMA origin difficult and potentially impossible.   Following this prolonged and detailed discussion (including my drawing of multiple pictures to demonstrate her anatomy), including explaining to the patient that given the presence of a type I endoleak, I find it unlikely the endoleak will resolve on its own, the patient wishes to pursue conservative management.    As such, a follow-up post EVAR stent CTA of the abdomen and pelvis will be performed in March of next year. The patient will return to the interventional radiology clinic following the acquisition of this examination to review the results of this exam and for continued management.  The patient was instructed to call the interventional radiology clinic with any interval questions or concerns. The patient was also instructed that if she were to develop the sudden onset of severe abdominal pain (potentially indicative of an abdominal aortic rupture) she should report to the emergency department expeditiously.  The patient demonstrated excellent understanding of the above discussion.  Thank you for this interesting consult.  I  greatly enjoyed meeting [facility] and look forward to participating in their care.  A copy of this report was sent to the requesting provider on this date.  SignedSandi Mariscal 06/12/2015, 1:52 PM   I spent a total of 30 Minutes in face to face in clinical consultation, greater than 50% of which was counseling/coordinating care for potential endoleak repair following EVAR

## 2015-10-23 ENCOUNTER — Other Ambulatory Visit (HOSPITAL_COMMUNITY): Payer: Self-pay | Admitting: Interventional Radiology

## 2015-10-23 ENCOUNTER — Other Ambulatory Visit: Payer: Self-pay | Admitting: Radiology

## 2015-10-23 DIAGNOSIS — T82330D Leakage of aortic (bifurcation) graft (replacement), subsequent encounter: Secondary | ICD-10-CM

## 2015-10-23 DIAGNOSIS — IMO0001 Reserved for inherently not codable concepts without codable children: Secondary | ICD-10-CM

## 2015-10-25 ENCOUNTER — Encounter: Payer: Self-pay | Admitting: Radiology

## 2015-11-05 ENCOUNTER — Ambulatory Visit
Admission: RE | Admit: 2015-11-05 | Discharge: 2015-11-05 | Disposition: A | Discharge status: Home / Self Care | Payer: Medicare Other | Source: Ambulatory Visit | Attending: Interventional Radiology | Admitting: Interventional Radiology

## 2015-11-05 DIAGNOSIS — T82330D Leakage of aortic (bifurcation) graft (replacement), subsequent encounter: Secondary | ICD-10-CM

## 2015-11-05 DIAGNOSIS — IMO0001 Reserved for inherently not codable concepts without codable children: Secondary | ICD-10-CM

## 2015-11-05 NOTE — Progress Notes (Signed)
Patient ID: Megan Fleming, female   DOB: 02-22-1946, 70 y.o.   MRN: [id number]        Chief Complaint: Endoleak post endovascular repair of an infrarenal abdominal aortic aneurysm  Referring Physician(s): Albertine Patricia  History of Present Illness: Megan Fleming is a 70 y.o. female with past medical history significant for COPD, pulmonary embolism (not currently on anticoagulation) and diabetes who underwent a endovascular repair of an infrarenal abdominal aortic aneurysm on 05/08/2015.   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 At that time, the patient wished to pursue conservative management with continued surveillance and as such, the patient returns to the interventional radiology clinic to discuss the results of surveillance post stent repair protocol CT of the abdomen and pelvis performed 10/29/2015.  The patient is accompanied by her husband though serves as her own historian. The patient is currently without complaint. She denies abdominal pain.    Past Medical History  Diagnosis Date  . Hypothyroidism   . Hypertension   . Heart murmur   . Shortness of breath     On exertion- followed by pulmonologist-Dr. Greggory Stallion.  Marland Kitchen COPD (chronic obstructive pulmonary disease) ([place])     [place]  . Aneurysm ([place]) 2011    size=4.1 at Aorta, sees Dr. Ashok Cordia Cruz(vascular)  . Bladder tumor ?2000?    incontinence-? intertistitial cystitis? NOT cancer (bladder biopsy negative)  . Hx of blood clots 12/2009    PE May 2011  . Diabetes mellitus without complication ([place])   . 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 Arthritis     Fingers, left knee, possibly right hip    Past Surgical History  Procedure Laterality Date  . Cesarean section  [id number]       . Tonsillectomy  1959  . Tubal  ligation  1976  . Abdominal aortic aneurysm repair  04/2015    stent placed; repaired left renal artery stenosis with stent as incidental finding    Allergies: Review of patient's allergies indicates no known allergies.  Medications: Prior to Admission medications   Medication Sig Start Date End Date Taking? Authorizing Provider  albuterol (PROVENTIL HFA;VENTOLIN HFA) 108 (90 BASE) MCG/ACT inhaler Inhale 2 puffs into the lungs every 4 (four) hours as needed. For wheezing   Yes Historical Provider, MD  aspirin 325 MG tablet Take 325 mg by mouth.   Yes Historical Provider, MD  Calcium Carbonate-Vitamin D (CALCIUM + D PO) Take 1 tablet by mouth daily.   Yes Historical Provider, MD  conjugated estrogens (PREMARIN) vaginal cream Place 0.5 g vaginally 2 (two) times a week. Uses on Mon and Fri   Yes Historical Provider, MD  DULoxetine (CYMBALTA) 60 MG capsule Take 60 mg by mouth.   Yes Historical Provider, MD  fluticasone (VERAMYST) 27.5 MCG/SPRAY nasal spray Place 2 sprays into the nose at bedtime.   Yes Historical Provider, MD  fluticasone furoate-vilanterol (BREO ELLIPTA) 100-25 MCG/INH AEPB Inhale 1 puff into the lungs daily.   Yes Historical Provider, MD  gemfibrozil (LOPID) 600 MG tablet Take 600 mg by mouth 2 (two) times daily before a meal.   Yes Historical Provider, MD  guaiFENesin (MUCINEX) 600 MG 12 hr tablet Take 600 mg by mouth daily.   Yes Historical Provider, MD  ibandronate (BONIVA) 150 MG tablet Take 150 mg by mouth every 30 (thirty) days. Take in the  morning with a full glass of water, on an empty stomach, and do not take anything else by mouth or lie down for the next 30 min. Takes on the 5th of every month.   Yes Historical Provider, MD  ibuprofen (ADVIL,MOTRIN) 600 MG tablet Take 600 mg by mouth every 6 (six) hours as needed.   Yes Historical Provider, MD  KRILL OIL OMEGA-3 PO Take 1 capsule by mouth daily.   Yes Historical Provider, MD  levothyroxine (SYNTHROID, LEVOTHROID) 88  MCG tablet Take 88 mcg by mouth every morning.   Yes Historical Provider, MD  lisinopril (PRINIVIL,ZESTRIL) 10 MG tablet Take 5 mg by mouth daily.   Yes Historical Provider, MD  metFORMIN (GLUMETZA) 500 MG (MOD) 24 hr tablet Take 500 mg by mouth 2 (two) times daily with a meal.   Yes Historical Provider, MD  Multiple Vitamins-Minerals (ONE DAILY MULTIVITAMIN WOMEN PO) Take by mouth.   Yes Historical Provider, MD  nebivolol (BYSTOLIC) 10 MG tablet Take 10 mg by mouth daily.   Yes Historical Provider, MD  niacin (NIASPAN) 500 MG CR tablet Take 500 mg by mouth at bedtime.   Yes Historical Provider, MD  pentosan polysulfate (ELMIRON) 100 MG capsule 1 CAPSULE EVERY 12 HOURS 02/28/15  Yes Historical Provider, MD  polyvinyl alcohol (LIQUIFILM TEARS) 1.4 % ophthalmic solution 1 drop as needed for dry eyes.   Yes Historical Provider, MD  valACYclovir (VALTREX) 1000 MG tablet Take 1,000 mg by mouth.   Yes Historical Provider, MD  budesonide-formoterol (SYMBICORT) 160-4.5 MCG/ACT inhaler Inhale 2 puffs into the lungs 2 (two) times daily. Reported on 11/05/2015    Historical Provider, MD  Calcium Carbonate-Vitamin D (CALCIUM-VITAMIN D) 500-200 MG-UNIT tablet Take by mouth.    Historical Provider, MD  HYDROcodone-acetaminophen (NORCO/VICODIN) 5-325 MG per tablet Take 1-2 tablets by mouth every 4 (four) hours as needed. Patient not taking: Reported on 06/12/2015 09/05/12   Karie Chimera, MD  Multiple Vitamin (MULTIVITAMIN WITH MINERALS) TABS Take 1 tablet by mouth daily.    Historical Provider, MD  venlafaxine (EFFEXOR) 75 MG tablet Take 150 mg by mouth daily. Reported on 11/05/2015    Historical Provider, MD     No family history on file.  Social History   Social History  . Marital Status: Married    Spouse Name: N/A  . Number of Children: N/A  . Years of Education: N/A   Social History Main Topics  . Smoking status: Former Smoker    Types: Cigarettes    Quit date: 06/26/2003  . Smokeless tobacco: Not  on file  . Alcohol Use: No  . Drug Use: Not on file  . Sexual Activity: Not on file   Other Topics Concern  . Not on file   Social History Narrative    ECOG Status: 0 - Asymptomatic  Review of Systems: A 12 point ROS discussed and pertinent positives are indicated in the HPI above.  All other systems are negative.  Review of Systems  Constitutional: Negative for activity change.  Respiratory: Negative.   Cardiovascular: Negative for chest pain and leg swelling.  Gastrointestinal: Negative for abdominal pain.    Vital Signs: BP 111/69 mmHg  Pulse 75  Temp(Src) 98.3 F (36.8 C) (Oral)  Resp 15  Ht 5\' 2"  (1.575 m)  Wt 166 lb (75.297 kg)  BMI 30.35 kg/m2  SpO2 90%  Physical Exam   Imaging:  CTA of the abdomen and pelvis - 10/29/2015; 05/31/2015.  Selected images from both examination were reviewed  in detail with the patient and the patient's husband.  Findings compatible with a persistent combined type 1A/type II endoleak with opacification of the native abdominal aortic aneurysm adjacent to the left renal artery stent and stent with outflow likely be a combination of L3 and L4 lumbar arteries as well as the IMA. Overall, the opacification of the native abdominal aorta is much less conspicuous when compared to the 05/2015 examination and there has been no interval change in the size of the native abdominal aortic aneurysm, again measuring approximately 5 cm in diameter. No periaortic stranding.  Labs:  CBC: No results for input(s): WBC, HGB, HCT, PLT in the last 8760 hours.  COAGS: No results for input(s): INR, APTT in the last 8760 hours.  BMP: No results for input(s): NA, K, CL, CO2, GLUCOSE, BUN, CALCIUM, CREATININE, GFRNONAA, GFRAA in the last 8760 hours.  Invalid input(s): CMP  Assessment and Plan:  Megan Fleming is a 70 y.o. female with past medical history significant for COPD, pulmonary embolism (not currently on anticoagulation) and diabetes who  underwent a endovascular repair of an infrarenal abdominal aortic aneurysm on 05/08/2015. At that time, the patient wished to pursue conservative management with continued surveillance.  Selected images from post endovascular stent repair protocol CT of the abdomen and pelvis performed 10/29/2015 and 05/31/2015 were reviewed in detail with the patient and the patient's husband.  Fortunately, surveillance examination performed 10/29/2015 demonstrates an overall decreased size and conspicuity of previously noted combined type 1A/type II endoleak. More importantly, there is been no interval change in the size of the native abdominal aortic aneurysm, again measuring 5 cm in diameter.  As such, I have recommended continued surveillance of this decreased though persistent endoleak with follow-up post stent repair protocol CT scan to be performed in 6 months (September 2017). The patient will return to the interventional radiology clinic following the acquisition of this surveillance examination to discuss the findings.  The patient was encouraged to call the interventional radiology clinic with any interval questions or concerns.  A copy of this report was sent to the requesting provider on this date.  Electronically Signed: Sandi Mariscal 11/05/2015, 1:25 PM   I spent a total of 15 Minutes in face to face in clinical consultation, greater than 50% of which was counseling/coordinating care for abdominal aortic aneurysm, post endovascular repair with endoleak

## 2016-05-06 ENCOUNTER — Other Ambulatory Visit (HOSPITAL_COMMUNITY): Payer: Self-pay | Admitting: Radiology

## 2016-05-06 ENCOUNTER — Other Ambulatory Visit (HOSPITAL_COMMUNITY): Payer: Self-pay | Admitting: Interventional Radiology

## 2016-05-06 DIAGNOSIS — IMO0001 Reserved for inherently not codable concepts without codable children: Secondary | ICD-10-CM

## 2016-05-06 DIAGNOSIS — T82330D Leakage of aortic (bifurcation) graft (replacement), subsequent encounter: Secondary | ICD-10-CM

## 2016-05-18 ENCOUNTER — Telehealth: Payer: Self-pay | Admitting: Radiology

## 2016-05-18 NOTE — Telephone Encounter (Signed)
Patient states that Dr. Denyce Robert to order/schedule follow up US for Dec 2017.  Based on that he will determine if she should have follow up CTA w/ referral back to Dr. Ronny Bacon.  Shavonn Convey Riki Rusk, RN 05/18/2016 12:22 PM

## 2016-09-03 ENCOUNTER — Telehealth: Payer: Self-pay | Admitting: Radiology

## 2016-09-03 NOTE — Telephone Encounter (Signed)
Left message on M# requesting patient call back to schedule follow up imaging & appointment w/ Dr Ronny Bacon.  Virdell Hoiland Riki Rusk, RN 09/03/2016 12:10 PM

## 2016-10-01 ENCOUNTER — Telehealth: Payer: Self-pay | Admitting: Radiology

## 2016-10-01 NOTE — Telephone Encounter (Signed)
Left message on patient's voice mail:  Request that she call back to schedule an appointment or to notify our office if she no longer wants to follow with Dr Pascal Lux.    NOTE:  This has been our 4th attempt to contact this patient to schedule follow up appointment with Dr Pascal Lux.  Per Dr Pascal Lux:  "Our office will no longer try to arrange for follow up for this patient and should be re-referred to our clinic if the need arises in the future".   Neidy Guerrieri Riki Rusk, [place] 10/01/2016 9:11 AM

## 2021-03-10 DEATH — deceased
